# Patient Record
Sex: Male | Born: 1980 | Race: Black or African American | Hispanic: No | Marital: Single | Smoking: Former smoker
Health system: Southern US, Community
[De-identification: ages and names within clinical notes are randomized; demographics above are authoritative.]

## PROBLEM LIST (undated history)

## (undated) DIAGNOSIS — F1210 Cannabis abuse, uncomplicated: Secondary | ICD-10-CM

## (undated) DIAGNOSIS — D509 Iron deficiency anemia, unspecified: Secondary | ICD-10-CM

## (undated) DIAGNOSIS — N183 Chronic kidney disease, stage 3 unspecified: Secondary | ICD-10-CM

## (undated) DIAGNOSIS — I10 Essential (primary) hypertension: Secondary | ICD-10-CM

## (undated) DIAGNOSIS — Z720 Tobacco use: Secondary | ICD-10-CM

## (undated) DIAGNOSIS — E669 Obesity, unspecified: Secondary | ICD-10-CM

## (undated) DIAGNOSIS — I5042 Chronic combined systolic (congestive) and diastolic (congestive) heart failure: Secondary | ICD-10-CM

## (undated) HISTORY — PX: KNEE SURGERY: SHX244

---

## 2012-01-23 ENCOUNTER — Inpatient Hospital Stay (HOSPITAL_COMMUNITY)
Admission: EM | Admit: 2012-01-23 | Discharge: 2012-01-24 | DRG: 304 | Disposition: A | Discharge status: Home / Self Care | Payer: 59 | Attending: Internal Medicine | Admitting: Internal Medicine

## 2012-01-23 ENCOUNTER — Encounter (HOSPITAL_COMMUNITY): Payer: Self-pay | Admitting: Emergency Medicine

## 2012-01-23 ENCOUNTER — Emergency Department (HOSPITAL_COMMUNITY): Payer: 59

## 2012-01-23 DIAGNOSIS — D72829 Elevated white blood cell count, unspecified: Secondary | ICD-10-CM | POA: Diagnosis present

## 2012-01-23 DIAGNOSIS — R0789 Other chest pain: Secondary | ICD-10-CM | POA: Diagnosis present

## 2012-01-23 DIAGNOSIS — I10 Essential (primary) hypertension: Principal | ICD-10-CM | POA: Diagnosis present

## 2012-01-23 DIAGNOSIS — N179 Acute kidney failure, unspecified: Secondary | ICD-10-CM | POA: Diagnosis present

## 2012-01-23 DIAGNOSIS — I498 Other specified cardiac arrhythmias: Secondary | ICD-10-CM | POA: Diagnosis present

## 2012-01-23 DIAGNOSIS — Z9119 Patient's noncompliance with other medical treatment and regimen: Secondary | ICD-10-CM

## 2012-01-23 DIAGNOSIS — D638 Anemia in other chronic diseases classified elsewhere: Secondary | ICD-10-CM | POA: Diagnosis present

## 2012-01-23 DIAGNOSIS — D649 Anemia, unspecified: Secondary | ICD-10-CM

## 2012-01-23 DIAGNOSIS — Z91199 Patient's noncompliance with other medical treatment and regimen due to unspecified reason: Secondary | ICD-10-CM

## 2012-01-23 DIAGNOSIS — J189 Pneumonia, unspecified organism: Secondary | ICD-10-CM | POA: Diagnosis present

## 2012-01-23 DIAGNOSIS — I169 Hypertensive crisis, unspecified: Secondary | ICD-10-CM

## 2012-01-23 DIAGNOSIS — I160 Hypertensive urgency: Secondary | ICD-10-CM | POA: Diagnosis present

## 2012-01-23 DIAGNOSIS — D509 Iron deficiency anemia, unspecified: Secondary | ICD-10-CM | POA: Diagnosis present

## 2012-01-23 DIAGNOSIS — F1210 Cannabis abuse, uncomplicated: Secondary | ICD-10-CM | POA: Diagnosis present

## 2012-01-23 HISTORY — DX: Essential (primary) hypertension: I10

## 2012-01-23 HISTORY — DX: Obesity, unspecified: E66.9

## 2012-01-23 LAB — CBC WITH DIFFERENTIAL/PLATELET
Basophils Relative: 0 % (ref 0–1)
Eosinophils Relative: 0 % (ref 0–5)
HCT: 34 % — ABNORMAL LOW (ref 39.0–52.0)
MCH: 24.3 pg — ABNORMAL LOW (ref 26.0–34.0)
Monocytes Absolute: 0.6 10*3/uL (ref 0.1–1.0)
Monocytes Relative: 6 % (ref 3–12)
Neutrophils Relative %: 78 % — ABNORMAL HIGH (ref 43–77)

## 2012-01-23 LAB — COMPREHENSIVE METABOLIC PANEL
AST: 17 U/L (ref 0–37)
BUN: 20 mg/dL (ref 6–23)
CO2: 23 mEq/L (ref 19–32)
Calcium: 9 mg/dL (ref 8.4–10.5)
Chloride: 105 mEq/L (ref 96–112)
Creatinine, Ser: 1.31 mg/dL (ref 0.50–1.35)
GFR calc Af Amer: 84 mL/min — ABNORMAL LOW (ref >90)
GFR calc non Af Amer: 72 mL/min — ABNORMAL LOW (ref >90)
Glucose, Bld: 117 mg/dL — ABNORMAL HIGH (ref 70–99)
Total Bilirubin: 1 mg/dL (ref 0.3–1.2)

## 2012-01-23 LAB — PHOSPHORUS: Phosphorus: 3.7 mg/dL (ref 2.3–4.6)

## 2012-01-23 LAB — BASIC METABOLIC PANEL
CO2: 23 mEq/L (ref 19–32)
Calcium: 9 mg/dL (ref 8.4–10.5)
Chloride: 104 mEq/L (ref 96–112)
Glucose, Bld: 129 mg/dL — ABNORMAL HIGH (ref 70–99)
Potassium: 4.1 mEq/L (ref 3.5–5.1)
Sodium: 139 mEq/L (ref 135–145)

## 2012-01-23 LAB — LACTIC ACID, PLASMA: Lactic Acid, Venous: 1.1 mmol/L (ref 0.5–2.2)

## 2012-01-23 LAB — CBC
Hemoglobin: 11.8 g/dL — ABNORMAL LOW (ref 13.0–17.0)
MCH: 24.3 pg — ABNORMAL LOW (ref 26.0–34.0)
RBC: 4.85 MIL/uL (ref 4.22–5.81)
WBC: 11 10*3/uL — ABNORMAL HIGH (ref 4.0–10.5)

## 2012-01-23 LAB — APTT: aPTT: 34 seconds (ref 24–37)

## 2012-01-23 LAB — GLUCOSE, CAPILLARY
Glucose-Capillary: 121 mg/dL — ABNORMAL HIGH (ref 70–99)
Glucose-Capillary: 114 mg/dL — ABNORMAL HIGH (ref 70–99)

## 2012-01-23 LAB — TROPONIN I: Troponin I: <0.3 ng/mL (ref <0.30)

## 2012-01-23 LAB — POCT I-STAT TROPONIN I: Troponin i, poc: 0.06 ng/mL (ref 0.00–0.08)

## 2012-01-23 LAB — PROTIME-INR: INR: 1.16 (ref 0.00–1.49)

## 2012-01-23 LAB — TSH: TSH: 1.217 u[IU]/mL (ref 0.350–4.500)

## 2012-01-23 MED ORDER — CLONIDINE HCL 0.2 MG PO TABS
0.2000 mg | ORAL_TABLET | Freq: Two times a day (BID) | ORAL | Status: DC
  Filled 2012-01-23 (×2): qty 1

## 2012-01-23 MED ORDER — MORPHINE SULFATE 4 MG/ML IJ SOLN
4.0000 mg | Freq: Once | INTRAMUSCULAR | Status: AC
  Administered 2012-01-23: 4 mg via INTRAVENOUS
  Filled 2012-01-23: qty 1

## 2012-01-23 MED ORDER — ONDANSETRON HCL 4 MG/2ML IJ SOLN: 4.0000 mg | Freq: Four times a day (QID) | INTRAMUSCULAR | Status: DC | PRN

## 2012-01-23 MED ORDER — HYDROCODONE-ACETAMINOPHEN 5-325 MG PO TABS
1.0000 | ORAL_TABLET | ORAL | Status: DC | PRN
  Administered 2012-01-23 – 2012-01-24 (×2): 2 via ORAL
  Filled 2012-01-23 (×2): qty 2

## 2012-01-23 MED ORDER — HYDRALAZINE HCL 50 MG PO TABS
100.0000 mg | ORAL_TABLET | Freq: Three times a day (TID) | ORAL | Status: DC
  Administered 2012-01-23 – 2012-01-24 (×3): 100 mg via ORAL
  Filled 2012-01-23 (×5): qty 2

## 2012-01-23 MED ORDER — MAGNESIUM OXIDE 400 (241.3 MG) MG PO TABS
400.0000 mg | ORAL_TABLET | Freq: Every day | ORAL | Status: DC
  Administered 2012-01-23 – 2012-01-24 (×2): 400 mg via ORAL
  Filled 2012-01-23 (×2): qty 1

## 2012-01-23 MED ORDER — NITROGLYCERIN IN D5W 200-5 MCG/ML-% IV SOLN
5.0000 ug/min | INTRAVENOUS | Status: DC
  Administered 2012-01-23: 5 ug/min via INTRAVENOUS
  Filled 2012-01-23: qty 250

## 2012-01-23 MED ORDER — CARVEDILOL 25 MG PO TABS
25.0000 mg | ORAL_TABLET | Freq: Two times a day (BID) | ORAL | Status: DC
  Administered 2012-01-23 – 2012-01-24 (×2): 25 mg via ORAL
  Filled 2012-01-23 (×4): qty 1

## 2012-01-23 MED ORDER — HYDRALAZINE HCL 100 MG PO TABS: 100.0000 mg | ORAL_TABLET | Freq: Three times a day (TID) | ORAL | Status: DC

## 2012-01-23 MED ORDER — ONDANSETRON HCL 4 MG/2ML IJ SOLN
4.0000 mg | Freq: Once | INTRAMUSCULAR | Status: AC
  Administered 2012-01-23: 4 mg via INTRAVENOUS
  Filled 2012-01-23: qty 2

## 2012-01-23 MED ORDER — CLONIDINE HCL 0.3 MG PO TABS
0.3000 mg | ORAL_TABLET | Freq: Three times a day (TID) | ORAL | Status: DC
  Administered 2012-01-23 – 2012-01-24 (×4): 0.3 mg via ORAL
  Filled 2012-01-23 (×8): qty 1

## 2012-01-23 MED ORDER — FUROSEMIDE 10 MG/ML IJ SOLN
40.0000 mg | Freq: Every day | INTRAMUSCULAR | Status: DC
Start: 2012-01-23 — End: 2012-01-24
  Administered 2012-01-23: 40 mg via INTRAVENOUS
  Filled 2012-01-23 (×2): qty 4

## 2012-01-23 MED ORDER — AMLODIPINE BESYLATE 10 MG PO TABS
10.0000 mg | ORAL_TABLET | Freq: Every day | ORAL | Status: DC
  Administered 2012-01-23 – 2012-01-24 (×2): 10 mg via ORAL
  Filled 2012-01-23 (×2): qty 1

## 2012-01-23 MED ORDER — ONDANSETRON HCL 4 MG PO TABS: 4.0000 mg | ORAL_TABLET | Freq: Four times a day (QID) | ORAL | Status: DC | PRN

## 2012-01-23 MED ORDER — SODIUM CHLORIDE 0.9 % IJ SOLN
3.0000 mL | Freq: Two times a day (BID) | INTRAMUSCULAR | Status: DC
  Administered 2012-01-23 – 2012-01-24 (×3): 3 mL via INTRAVENOUS

## 2012-01-23 MED ORDER — MAGNESIUM OXIDE 400 MG PO TABS: 400.0000 mg | ORAL_TABLET | Freq: Every day | ORAL | Status: DC

## 2012-01-23 MED ORDER — LEVOFLOXACIN IN D5W 750 MG/150ML IV SOLN
750.0000 mg | INTRAVENOUS | Status: DC
  Administered 2012-01-23: 750 mg via INTRAVENOUS
  Filled 2012-01-23 (×2): qty 150

## 2012-01-23 MED ORDER — NICARDIPINE HCL IN NACL 20-0.86 MG/200ML-% IV SOLN
5.0000 mg/h | Freq: Once | INTRAVENOUS | Status: AC
  Administered 2012-01-23: 5 mg/h via INTRAVENOUS
  Filled 2012-01-23 (×2): qty 200

## 2012-01-23 MED ORDER — IOHEXOL 350 MG/ML SOLN: 125.0000 mL | Freq: Once | INTRAVENOUS | Status: AC | PRN

## 2012-01-23 NOTE — ED Notes (Signed)
Pt alert and oriented x4. Respirations even and unlabored, bilateral symmetrical rise and fall of chest. Skin warm and dry. In no acute distress. Denies needs.   

## 2012-01-23 NOTE — ED Notes (Signed)
Pt alert, arrives from work, c/o chest pain, sob, onset was a few days ago, describe the pain as "tightness in my chest, sob", resp even unlabored, ambulates to triage

## 2012-01-23 NOTE — ED Provider Notes (Signed)
History     CSN: [id number]  Arrival date & time 01/23/12  0227   First MD Initiated Contact with Patient 01/23/12 0319      Chief Complaint  Patient presents with  . Chest Pain    (Consider location/radiation/quality/duration/timing/severity/associated sxs/prior treatment) HPI History provided by patient. Over the last week has been having worsening chest pain, sharp in quality associated shortness of breath. Worse with exertion. Symptoms have been moderate and tonight became more severe while ambulating at work. No leg pain or leg swelling. No cough or fevers. Has a history of hypertension but has not been taking medications in months. Last year he required admission to the hospital for elevated blood pressure. He was sent home with multiple blood pressure medications that he was unable to afford. No headache, abdominal pain weakness. No nausea vomiting. Pain is not radiating and currently 9/10 in severity. He is having the same symptoms that require admission the past. Past Medical History  Diagnosis Date  . Hypertension     History reviewed. No pertinent past surgical history.  No family history on file.  History  Substance Use Topics  . Smoking status: Never Smoker   . Smokeless tobacco: Not on file  . Alcohol Use: No      Review of Systems  Constitutional: Negative for fever and chills.  HENT: Negative for neck pain and neck stiffness.   Eyes: Negative for pain.  Respiratory: Positive for shortness of breath.   Cardiovascular: Positive for chest pain.  Gastrointestinal: Negative for abdominal pain.  Genitourinary: Negative for dysuria.  Musculoskeletal: Negative for back pain.  Skin: Negative for rash.  Neurological: Negative for headaches.  All other systems reviewed and are negative.    Allergies  Review of patient's allergies indicates no known allergies.  Home Medications   Current Outpatient Rx  Name  Route  Sig  Dispense  Refill  . HYDRALAZINE HCL  PO   Oral   Take by mouth.         Marland Kitchen LISINOPRIL PO   Oral   Take by mouth.         . OXYCODONE HCL PO   Oral   Take by mouth.         . COREG PO   Oral   Take by mouth.         . CLONIDINE HCL PO   Oral   Take by mouth.         Marland Kitchen LASIX PO   Oral   Take by mouth.         Marland Kitchen MAGNESIUM PO   Oral   Take by mouth.           BP 192/123  Pulse 121  Temp 98.7 F (37.1 C) (Oral)  Resp 22  Ht 6\' 2"  (1.88 m)  Wt 315 lb (142.883 kg)  BMI 40.44 kg/m2  SpO2 100%  Physical Exam  Constitutional: He is oriented to person, place, and time. He appears well-developed and well-nourished.  HENT:  Head: Normocephalic and atraumatic.  Eyes: Conjunctivae normal and EOM are normal. Pupils are equal, round, and reactive to light.  Neck: Trachea normal. Neck supple. No thyromegaly present.  Cardiovascular: Normal rate, regular rhythm, S1 normal, S2 normal and normal pulses.     No systolic murmur is present   No diastolic murmur is present  Pulses:      Radial pulses are 2+ on the right side, and 2+ on the left side.  Pulmonary/Chest:  He has no rhonchi. He exhibits no tenderness.       Decreased breath sounds worse on the right. No respiratory distress.   Abdominal: Soft. Normal appearance and bowel sounds are normal. There is no tenderness. There is no CVA tenderness and negative Murphy's sign.  Musculoskeletal:       BLE:s Calves nontender, no cords or erythema, negative Homans sign  Neurological: He is alert and oriented to person, place, and time. He has normal strength. No cranial nerve deficit or sensory deficit. GCS eye subscore is 4. GCS verbal subscore is 5. GCS motor subscore is 6.  Skin: Skin is warm and dry. No rash noted. He is not diaphoretic.  Psychiatric: His speech is normal.       Cooperative and appropriate    ED Course  Procedures (including critical care time)  Results for orders placed during the hospital encounter of 01/23/12  CBC       Component Value Range   WBC 11.0 (*) 4.0 - 10.5 K/uL   RBC 4.85  4.22 - 5.81 MIL/uL   Hemoglobin 11.8 (*) 13.0 - 17.0 g/dL   HCT 16.1 (*) 09.6 - 04.5 %   MCV 69.7 (*) 78.0 - 100.0 fL   MCH 24.3 (*) 26.0 - 34.0 pg   MCHC 34.9  30.0 - 36.0 g/dL   RDW 40.9 (*) 81.1 - 91.4 %   Platelets 150  150 - 400 K/uL  BASIC METABOLIC PANEL      Component Value Range   Sodium 139  135 - 145 mEq/L   Potassium 4.1  3.5 - 5.1 mEq/L   Chloride 104  96 - 112 mEq/L   CO2 23  19 - 32 mEq/L   Glucose, Bld 129 (*) 70 - 99 mg/dL   BUN 22  6 - 23 mg/dL   Creatinine, Ser 7.82 (*) 0.50 - 1.35 mg/dL   Calcium 9.0  8.4 - 95.6 mg/dL   GFR calc non Af Amer 64 (*) >90 mL/min   GFR calc Af Amer 74 (*) >90 mL/min  LACTIC ACID, PLASMA      Component Value Range   Lactic Acid, Venous 1.1  0.5 - 2.2 mmol/L  TROPONIN I      Component Value Range   Troponin I <0.30  <0.30 ng/mL  POCT I-STAT TROPONIN I      Component Value Range   Troponin i, poc 0.06  0.00 - 0.08 ng/mL   Comment 3            Ct Angio Chest Pe W/cm &/or Wo Cm  01/23/2012  *RADIOLOGY REPORT*  Clinical Data: Chest pain and tightness for a few days.  Shortness of breath.  CT ANGIOGRAPHY CHEST  Technique:  Multidetector CT imaging of the chest using the standard protocol during bolus administration of intravenous contrast. Multiplanar reconstructed images including MIPs were obtained and reviewed to evaluate the vascular anatomy.  Contrast:  125 ml Omnipaque 350  Comparison: Chest radiograph 01/23/2012.  Findings: Study is somewhat technically limited due to limited contrast bolus but there is relatively good visualization of the central and proximal segmental pulmonary arteries.  No focal filling defects are demonstrated in the visualized vessels.  No evidence of large central pulmonary embolus.  Patchy diffusely scattered areas of airspace consolidation with air bronchograms in both lungs, greater on the right, similar to prior radiographic appearance.   Changes may represent pneumonia, edema, or other consolidative process.  Diffuse cardiac enlargement.  Normal caliber thoracic aorta.  No significant mediastinal lymphadenopathy.  Esophagus is decompressed.  The no pleural effusions.  No pneumothorax.  Mild degenerative changes in the thoracic spine.  Images incidentally obtained of the upper abdomen demonstrate a small esophageal hiatal hernia.  IMPRESSION: Patchy diffuse scattered airspace consolidation in both lungs, greater on the right side. Technically suboptimal study for evaluation of pulmonary arteries but no central pulmonary emboli are visualized.   Original Report Authenticated By: Burman Nieves, M.D.    Dg Chest Port 1 View  01/23/2012  *RADIOLOGY REPORT*  Clinical Data: Right-sided chest pain.  Shortness of breath. Productive cough.  Blood in sputum.  PORTABLE CHEST - 1 VIEW  Comparison: None.  Findings: Shallow inspiration.  Cardiac enlargement with mild pulmonary vascular congestion.  There is diffuse airspace disease in the right with mild left perihilar airspace disease.  Findings may represent edema or pneumonia.  Alveolar hemorrhage can also have this appearance.  No blunting of costophrenic angles.  No pneumothorax.  IMPRESSION: Cardiac enlargement with pulmonary vascular congestion.  Bilateral perihilar infiltrates more prominent on the right suggesting pneumonia or edema.   Original Report Authenticated By: Burman Nieves, M.D.       Date: 01/23/2012  Rate: 129  Rhythm: sinus tachycardia  QRS Axis: left  Intervals: normal  ST/T Wave abnormalities: nonspecific ST changes  Conduction Disutrbances:none  Narrative Interpretation: ST with LVH  Old EKG Reviewed: none available  CRITICAL CARE Performed by: Sunnie Nielsen   Total critical care time: 40  Critical care time was exclusive of separately billable procedures and treating other patients.  Critical care was necessary to treat or prevent imminent or life-threatening  deterioration.  Critical care was time spent personally by me on the following activities: development of treatment plan with patient and/or surrogate as well as nursing, discussions with consultants, evaluation of patient's response to treatment, examination of patient, obtaining history from patient or surrogate, ordering and performing treatments and interventions, ordering and review of laboratory studies, ordering and review of radiographic studies, pulse oximetry and re-evaluation of patient's condition. IV NTG/Cardene drip. Close cardiac monitoring with blood pressure relatively unchanged. Pain symptoms improving.  0530: d/w DR Julian Reil, [facility], plan ICU admit. He requested PCCM consult. He evaluated patient bedside. He recommends stop NTG drip and start Cardene. BP has been unchanged with titrating NTG.  Case discussed as above with Dr Deterding PCCM and she feels PT can be admitted by MED, can consult in the am as needed.    MDM  Hypertensive urgency and 31 year old adult male with history of same. Is noncompliant with hypertension medications.  Workup as above with chest x-ray, CT scan, EKG and labs reviewed. No old records available. Vital signs and nursing notes reviewed.  IV drips for blood pressure control. IV morphine.   ICU admit      Sunnie Nielsen, MD 01/23/12 [phone]

## 2012-01-23 NOTE — H&P (Addendum)
[facility] History and Physical  Peter Dodson ZOX:[id number] DOB: 05-08-1980 DOA: 01/23/2012  Referring physician: ER physician PCP: Provider Not In System   Chief Complaint: chest pain  HPI:  31 year old male with history of HTN (multiple meds for BP control) and non-compliance secondary to financial resources who presented to ED with worsening chest pain over 1 day prior to this admission. Patient reported left sided chest pain, non radiating, sharp and 6-7/10 in intensity. Patient reports pain lasting from few minutes to hours. Pain is not relieved with rest but once the patient was started on nitroglycerin he felt better. Patient reported no associated nausea or vomiting. No shortness of breath, no palpitations, no fever or chills. No cough, no lightheadedness or dizziness or loss of consciousness. No headaches. No reports of blood in stool or urine. In Ed, patient was found to be hypertensive with BP 211/129 in addition to acute kidney injury. CT angio chest was done and was negative for PE. CXR showed possible pneumonia.  Assessment and Plan:  Principal Problem:  *Hypertensive crisis  With acute kidney injury secondary to non compliance with BP meds including clonidine, lasix, metoprolol  PE ruled out with negative CT chest angio  Started on Cardene drip and now SBP 150; appropriate response  We will restart clonidine 03. Mg TID per home regimen and we will slowly add home meds while tapering Cardene drip  Check UDS  Get 2 D ECHO  CM for help for financial resources  Active Problems:  Acute kidney injury  Secondary to hypertensive crisis  Patient did receive IV fluids in ED  We will stop the fluids  Check BMP in am  Leukocytosis  Likely due to pneumonia  Started Levaquin  Anemia  Probably of chronic disease  Hemoglobin stable on admission with no signs of active bleed  [name] [facility] [phone]   Review of Systems:  Constitutional: Negative for  fever, chills and malaise/fatigue. Negative for diaphoresis.  HENT: Negative for hearing loss, ear pain, nosebleeds, congestion, sore throat, neck pain, tinnitus and ear discharge.   Eyes: Negative for blurred vision, double vision, photophobia, pain, discharge and redness.  Respiratory: Negative for cough, hemoptysis, sputum production, shortness of breath, wheezing and stridor.   Cardiovascular: per HPI.  Gastrointestinal: Negative for nausea, vomiting and abdominal pain. Negative for heartburn, constipation, blood in stool and melena.  Genitourinary: Negative for dysuria, urgency, frequency, hematuria and flank pain.  Musculoskeletal: Negative for myalgias, back pain, joint pain and falls.  Skin: Negative for itching and rash.  Neurological: Negative for dizziness and weakness. Negative for tingling, tremors, sensory change, speech change, focal weakness, loss of consciousness and headaches.  Endo/Heme/Allergies: Negative for environmental allergies and polydipsia. Does not bruise/bleed easily.  Psychiatric/Behavioral: Negative for suicidal ideas. The patient is not nervous/anxious.      Past Medical History  Diagnosis Date  . Hypertension    History reviewed. No pertinent past surgical history. Social History:  reports that he has never smoked. He does not have any smokeless tobacco history on file. He reports that he does not drink alcohol. His drug history not on file.  No Known Allergies  Family History:  Family History  Problem Relation Age of Onset  . Hypertension Mother      Prior to Admission medications   Medication Sig Start Date End Date Taking? Authorizing Provider  HYDRALAZINE HCL PO Take by mouth.   Yes Historical Provider, MD  LISINOPRIL PO Take by mouth.   Yes Historical  Provider, MD  OXYCODONE HCL PO Take by mouth.   Yes Historical Provider, MD  Carvedilol (COREG PO) Take by mouth.    Historical Provider, MD  CLONIDINE HCL PO Take by mouth.    Historical  Provider, MD  Furosemide (LASIX PO) Take by mouth.    Historical Provider, MD  MAGNESIUM PO Take by mouth.    Historical Provider, MD   Physical Exam: Filed Vitals:   01/23/12 1610 01/23/12 0658 01/23/12 0710 01/23/12 0730  BP: 182/113 178/113 180/102 164/114  Pulse: 121 118 121 119  Temp:    98 F (36.7 C)  TempSrc:    Oral  Resp: 30 14 19 18   Height:      Weight:      SpO2: 100% 99% 100% 93%    Physical Exam  Constitutional: Appears well-developed and well-nourished. No distress.  HENT: Normocephalic. External right and left ear normal. Oropharynx is clear and moist.  Eyes: Conjunctivae and EOM are normal. PERRLA, no scleral icterus.  Neck: Normal ROM. Neck supple. No JVD. No tracheal deviation. No thyromegaly.  CVS: RRR, S1/S2 +, no murmurs, no gallops, no carotid bruit.  Pulmonary: Effort and breath sounds normal, no stridor, rhonchi, wheezes, rales.  Abdominal: Soft. BS +,  no distension, tenderness, rebound or guarding.  Musculoskeletal: Normal range of motion. No edema and no tenderness.  Lymphadenopathy: No lymphadenopathy noted, cervical, inguinal. Neuro: Alert. Normal reflexes, muscle tone coordination. No cranial nerve deficit. Skin: Skin is warm and dry. No rash noted. Not diaphoretic. No erythema. No pallor.  Psychiatric: Normal mood and affect. Behavior, judgment, thought content normal.   Labs on Admission:  Basic Metabolic Panel:  Lab 01/23/12 9604  NA 139  K 4.1  CL 104  CO2 23  GLUCOSE 129*  BUN 22  CREATININE 1.45*  CALCIUM 9.0  MG --  PHOS --   Liver Function Tests: No results found for this basename: AST:5,ALT:5,ALKPHOS:5,BILITOT:5,PROT:5,ALBUMIN:5 in the last 168 hours No results found for this basename: LIPASE:5,AMYLASE:5 in the last 168 hours No results found for this basename: AMMONIA:5 in the last 168 hours CBC:  Lab 01/23/12 0305  WBC 11.0*  NEUTROABS --  HGB 11.8*  HCT 33.8*  MCV 69.7*  PLT 150   Cardiac Enzymes:  Lab 01/23/12  0305  CKTOTAL --  CKMB --  TROPONINI <0.30   BNP: No components found with this basename: POCBNP:5 CBG: No results found for this basename: GLUCAP:5 in the last 168 hours  Radiological Exams on Admission: Ct Angio Chest Pe W/cm &/or Wo Cm 01/23/2012 IMPRESSION: Patchy diffuse scattered airspace consolidation in both lungs, greater on the right side. Technically suboptimal study for evaluation of pulmonary arteries but no central pulmonary emboli are visualized.   Original Report Authenticated By: Burman Nieves, M.D.    Dg Chest Port 1 View 01/23/2012  *  IMPRESSION: Cardiac enlargement with pulmonary vascular congestion.  Bilateral perihilar infiltrates more prominent on the right suggesting pneumonia or edema.   Original Report Authenticated By: Burman Nieves, M.D.     EKG: Normal sinus rhythm, no ST/T wave changes  Code Status: Full Family Communication: Pt at bedside Disposition Plan: Admit for further evaluation  [name], MD  [facility] Pager [phone]  If 7PM-7AM, please contact night-coverage [email or web address] Password [facility] 01/23/2012, 7:49 AM

## 2012-01-23 NOTE — ED Notes (Addendum)
Notified RN, Drenda Freeze patient complaining of chest and back pain. Notified RN, Drenda Freeze of elevated blood pressure and heart rate of 121.

## 2012-01-23 NOTE — ED Notes (Signed)
Pt returned from radiology.

## 2012-01-23 NOTE — ED Notes (Signed)
Pt taken to radiology

## 2012-01-23 NOTE — ED Notes (Signed)
Report called to kim, rn on floor

## 2012-01-23 NOTE — ED Notes (Signed)
First attempt to call report. rn in isolation room, will call back

## 2012-01-24 LAB — RAPID URINE DRUG SCREEN, HOSP PERFORMED
Barbiturates: NOT DETECTED
Cocaine: NOT DETECTED
Tetrahydrocannabinol: POSITIVE — AB

## 2012-01-24 LAB — CBC
HCT: 31.2 % — ABNORMAL LOW (ref 39.0–52.0)
Hemoglobin: 10.8 g/dL — ABNORMAL LOW (ref 13.0–17.0)
MCH: 24.2 pg — ABNORMAL LOW (ref 26.0–34.0)
RBC: 4.46 MIL/uL (ref 4.22–5.81)

## 2012-01-24 LAB — COMPREHENSIVE METABOLIC PANEL
ALT: 11 U/L (ref 0–53)
AST: 14 U/L (ref 0–37)
CO2: 29 mEq/L (ref 19–32)
Calcium: 8.8 mg/dL (ref 8.4–10.5)
Creatinine, Ser: 1.51 mg/dL — ABNORMAL HIGH (ref 0.50–1.35)
GFR calc non Af Amer: 61 mL/min — ABNORMAL LOW (ref >90)
Sodium: 136 mEq/L (ref 135–145)
Total Protein: 6.2 g/dL (ref 6.0–8.3)

## 2012-01-24 LAB — GLUCOSE, CAPILLARY: Glucose-Capillary: 98 mg/dL (ref 70–99)

## 2012-01-24 MED ORDER — LEVOFLOXACIN 500 MG PO TABS: 500.0000 mg | ORAL_TABLET | Freq: Every day | ORAL | Status: DC

## 2012-01-24 MED ORDER — AMLODIPINE BESYLATE 10 MG PO TABS: 10.0000 mg | ORAL_TABLET | Freq: Every day | ORAL | Status: DC

## 2012-01-24 MED ORDER — HYDRALAZINE HCL 100 MG PO TABS: 100.0000 mg | ORAL_TABLET | Freq: Three times a day (TID) | ORAL | Status: DC

## 2012-01-24 MED ORDER — CARVEDILOL 25 MG PO TABS: 25.0000 mg | ORAL_TABLET | Freq: Two times a day (BID) | ORAL | Status: DC

## 2012-01-24 MED ORDER — HYDROCODONE-ACETAMINOPHEN 5-325 MG PO TABS: 1.0000 | ORAL_TABLET | Freq: Four times a day (QID) | ORAL | Status: DC | PRN

## 2012-01-24 MED ORDER — HYDROCODONE-ACETAMINOPHEN 5-325 MG PO TABS: 1.0000 | ORAL_TABLET | ORAL | Status: DC | PRN

## 2012-01-24 MED ORDER — CLONIDINE HCL 0.3 MG PO TABS: 0.3000 mg | ORAL_TABLET | Freq: Three times a day (TID) | ORAL | Status: DC

## 2012-01-24 NOTE — Discharge Summary (Signed)
Physician Discharge Summary  Peter Dodson AVW:[id number] DOB: 1980/02/14 DOA: 01/23/2012  PCP: Provider Not In System  Admit date: 01/23/2012 Discharge date: 01/24/2012  Discharge Diagnoses:  Principal Problem:  *Hypertensive crisis Active Problems:  Acute kidney injury  Leukocytosis  Anemia  Discharge Condition: medically stable for discharge home today. I have advised the patient to stop take lisinopril and lasix due to acute kidney injury and then to follow up with his PCP to recheck kidney function.   Diet recommendation: low fat diet  History of present illness:  31 year old male with history of HTN (multiple meds for BP control) and non-compliance secondary to financial resources who presented to ED with worsening chest pain over 1 day prior to this admission. Patient reported left sided chest pain, non radiating, sharp and 6-7/10 in intensity. Patient reported pain lasting from few minutes to hours. Pain is not relieved with rest but once the patient was started on nitroglycerin he felt better. In Ed, patient was found to be hypertensive with BP 211/129 in addition to acute kidney injury. CT angio chest was done and was negative for PE. CXR showed possible pneumonia.   Assessment and Plan:   Principal Problem:  *Hypertensive crisis  With acute kidney injury secondary to non compliance with BP meds including clonidine, lasix, metoprolol  PE ruled out with negative CT chest angio  Started on Cardene drip and eventually we added home medications BP is now stable  UDS positive for opiates and THC CM for help for financial resources  Active Problems:  Acute kidney injury  Secondary to hypertensive crisis, lisinopril and lasix Stopped lasix and would not continue lisinopril (home medication) Leukocytosis  Likely due to pneumonia  Started Levaquin which the patient will continue for the next 7 days on discharge Anemia  Probably of chronic disease  No signs of active  bleed Hemoglobin 10.8 today  [name]  [facility]  [phone]      Discharge Exam: Filed Vitals:   01/24/12 0400  BP:   Pulse:   Temp: 98.2 F (36.8 C)  Resp:    Filed Vitals:   01/24/12 0042 01/24/12 0100 01/24/12 0300 01/24/12 0400  BP: 122/87 135/85 113/70   Pulse:  77    Temp:    98.2 F (36.8 C)  TempSrc:    Oral  Resp: 26 20 26    Height:      Weight:      SpO2: 92% 98% 98%     General: Pt is alert, follows commands appropriately, not in acute distress Cardiovascular: Regular rate and rhythm, S1/S2 +, no murmurs, no rubs, no gallops Respiratory: Clear to auscultation bilaterally, no wheezing, no crackles, no rhonchi Abdominal: Soft, non tender, non distended, bowel sounds +, no guarding Extremities: no edema, no cyanosis, pulses palpable bilaterally DP and PT Neuro: Grossly nonfocal  Discharge Instructions  Discharge Orders    Future Orders Please Complete By Expires   Diet - low sodium heart healthy      Increase activity slowly      Discharge instructions      Comments:   Please stop taking lisinopril and lasix due to kidney injury; follow up with your PCP in 2 weeks and have your kidney function rechecked.   Call MD for:  persistant nausea and vomiting      Call MD for:  severe uncontrolled pain      Call MD for:  difficulty breathing, headache or visual disturbances      Call MD  for:  persistant dizziness or light-headedness          Medication List     As of 01/24/2012  6:30 AM    STOP taking these medications         furosemide 20 MG tablet   Commonly known as: LASIX      lisinopril 40 MG tablet   Commonly known as: PRINIVIL,ZESTRIL      OXYCODONE HCL PO      TAKE these medications         amLODipine 10 MG tablet   Commonly known as: NORVASC   Take 1 tablet (10 mg total) by mouth daily.      carvedilol 25 MG tablet   Commonly known as: COREG   Take 1 tablet (25 mg total) by mouth 2 (two) times daily with a meal.      cloNIDine 0.3  MG tablet   Commonly known as: CATAPRES   Take 1 tablet (0.3 mg total) by mouth 3 (three) times daily.      hydrALAZINE 100 MG tablet   Commonly known as: APRESOLINE   Take 1 tablet (100 mg total) by mouth 3 (three) times daily.      HYDROcodone-acetaminophen 5-325 MG per tablet   Commonly known as: NORCO/VICODIN   Take 1-2 tablets by mouth every 4 (four) hours as needed.      magnesium oxide 400 MG tablet   Commonly known as: MAG-OX   Take 400 mg by mouth daily.           Follow-up Information    Follow up with Provider Not In System.          The results of significant diagnostics from this hospitalization (including imaging, microbiology, ancillary and laboratory) are listed below for reference.    Significant Diagnostic Studies: Ct Angio Chest Pe W/cm &/or Wo Cm  01/23/2012  *RADIOLOGY REPORT*  Clinical Data: Chest pain and tightness for a few days.  Shortness of breath.  CT ANGIOGRAPHY CHEST  Technique:  Multidetector CT imaging of the chest using the standard protocol during bolus administration of intravenous contrast. Multiplanar reconstructed images including MIPs were obtained and reviewed to evaluate the vascular anatomy.  Contrast:  125 ml Omnipaque 350  Comparison: Chest radiograph 01/23/2012.  Findings: Study is somewhat technically limited due to limited contrast bolus but there is relatively good visualization of the central and proximal segmental pulmonary arteries.  No focal filling defects are demonstrated in the visualized vessels.  No evidence of large central pulmonary embolus.  Patchy diffusely scattered areas of airspace consolidation with air bronchograms in both lungs, greater on the right, similar to prior radiographic appearance.  Changes may represent pneumonia, edema, or other consolidative process.  Diffuse cardiac enlargement.  Normal caliber thoracic aorta.  No significant mediastinal lymphadenopathy.  Esophagus is decompressed.  The no pleural  effusions.  No pneumothorax.  Mild degenerative changes in the thoracic spine.  Images incidentally obtained of the upper abdomen demonstrate a small esophageal hiatal hernia.  IMPRESSION: Patchy diffuse scattered airspace consolidation in both lungs, greater on the right side. Technically suboptimal study for evaluation of pulmonary arteries but no central pulmonary emboli are visualized.   Original Report Authenticated By: Burman Nieves, M.D.    Dg Chest Port 1 View  01/23/2012  *RADIOLOGY REPORT*  Clinical Data: Right-sided chest pain.  Shortness of breath. Productive cough.  Blood in sputum.  PORTABLE CHEST - 1 VIEW  Comparison: None.  Findings: Shallow inspiration.  Cardiac enlargement  with mild pulmonary vascular congestion.  There is diffuse airspace disease in the right with mild left perihilar airspace disease.  Findings may represent edema or pneumonia.  Alveolar hemorrhage can also have this appearance.  No blunting of costophrenic angles.  No pneumothorax.  IMPRESSION: Cardiac enlargement with pulmonary vascular congestion.  Bilateral perihilar infiltrates more prominent on the right suggesting pneumonia or edema.   Original Report Authenticated By: Burman Nieves, M.D.     Microbiology: Recent Results (from the past 240 hour(s))  MRSA PCR SCREENING     Status: Normal   Collection Time   01/23/12  8:57 AM      Component Value Range Status Comment   MRSA by PCR NEGATIVE  NEGATIVE Final      Labs: Basic Metabolic Panel:  Lab 01/24/12 4540 01/23/12 0825 01/23/12 0305  NA 136 139 139  K 4.3 3.8 4.1  CL 99 105 104  CO2 29 23 23   GLUCOSE 92 117* 129*  BUN 23 20 22   CREATININE 1.51* 1.31 1.45*  CALCIUM 8.8 9.0 9.0  MG -- 2.0 --  PHOS -- 3.7 --   Liver Function Tests:  Lab 01/24/12 0344 01/23/12 0825  AST 14 17  ALT 11 12  ALKPHOS 49 59  BILITOT 0.7 1.0  PROT 6.2 7.4  ALBUMIN 2.5* 3.1*   No results found for this basename: LIPASE:5,AMYLASE:5 in the last 168 hours No  results found for this basename: AMMONIA:5 in the last 168 hours CBC:  Lab 01/24/12 0344 01/23/12 0825 01/23/12 0305  WBC 4.9 9.3 11.0*  NEUTROABS -- 7.2 --  HGB 10.8* 12.0* 11.8*  HCT 31.2* 34.0* 33.8*  MCV 70.0* 68.8* 69.7*  PLT 127* 147* 150   Cardiac Enzymes:  Lab 01/23/12 0305  CKTOTAL --  CKMB --  CKMBINDEX --  TROPONINI <0.30   BNP: BNP (last 3 results) No results found for this basename: PROBNP:3 in the last 8760 hours CBG:  Lab 01/23/12 1225 01/23/12 0827  GLUCAP 114* 121*    Time coordinating discharge: Over 30 minutes  Signed:  [name], MD  [facility] 01/24/2012, 6:30 AM  Pager #: [phone]

## 2012-01-24 NOTE — Care Management Note (Signed)
    Page 1 of 1   01/24/2012     12:52:54 PM   CARE MANAGEMENT NOTE 01/24/2012  Patient:  Peter Dodson,Peter Dodson   Account Number:  [id number]  Date Initiated:  01/24/2012  Documentation initiated by:  Lanier Clam  Subjective/Objective Assessment:   admitted w/hypertensive crisis.     Action/Plan:   From home   Anticipated DC Date:  01/24/2012   Anticipated DC Plan:  HOME/SELF CARE      DC Planning Services  Medication Assistance  CM consult      Choice offered to / List presented to:             Status of service:  Completed, signed off Medicare Important Message given?   (If response is "NO", the following Medicare IM given date fields will be blank) Date Medicare IM given:   Date Additional Medicare IM given:    Discharge Disposition:  HOME/SELF CARE  Per UR Regulation:    If discussed at Long Length of Stay Meetings, dates discussed:    Comments:  01/24/12 Peter Hilgers RN,BSN NCM [phone] EXPLAINED THAT SINCE PATIENT HAS SCRIPT COVERAGE,DOES NOT QUALIFY FOR MED ASST WHEN YOU JUST CAN'T AFFORD MEDS.MEDS WERE CHECKED & MOST ARE ON $4 [employer].THOSE THAT WERE NOT $4 WERE:LEVAQUIN $10/5TABS.NORVASC-$5.NURSE UPDATED.

## 2012-01-24 NOTE — Progress Notes (Signed)
Utilization Review Completed.Cyprian Gongaware T12/15/2013   

## 2013-10-28 ENCOUNTER — Emergency Department (HOSPITAL_COMMUNITY): Payer: 59

## 2013-10-28 ENCOUNTER — Inpatient Hospital Stay (HOSPITAL_COMMUNITY)
Admission: EM | Admit: 2013-10-28 | Discharge: 2013-10-31 | DRG: 291 | Disposition: A | Discharge status: Home / Self Care | Payer: 59 | Attending: Internal Medicine | Admitting: Internal Medicine

## 2013-10-28 ENCOUNTER — Encounter (HOSPITAL_COMMUNITY): Payer: Self-pay | Admitting: Emergency Medicine

## 2013-10-28 DIAGNOSIS — I160 Hypertensive urgency: Secondary | ICD-10-CM | POA: Diagnosis present

## 2013-10-28 DIAGNOSIS — IMO0001 Reserved for inherently not codable concepts without codable children: Secondary | ICD-10-CM

## 2013-10-28 DIAGNOSIS — Z23 Encounter for immunization: Secondary | ICD-10-CM

## 2013-10-28 DIAGNOSIS — R0989 Other specified symptoms and signs involving the circulatory and respiratory systems: Secondary | ICD-10-CM

## 2013-10-28 DIAGNOSIS — I130 Hypertensive heart and chronic kidney disease with heart failure and stage 1 through stage 4 chronic kidney disease, or unspecified chronic kidney disease: Principal | ICD-10-CM | POA: Diagnosis present

## 2013-10-28 DIAGNOSIS — I110 Hypertensive heart disease with heart failure: Secondary | ICD-10-CM

## 2013-10-28 DIAGNOSIS — R030 Elevated blood-pressure reading, without diagnosis of hypertension: Secondary | ICD-10-CM

## 2013-10-28 DIAGNOSIS — E669 Obesity, unspecified: Secondary | ICD-10-CM | POA: Diagnosis present

## 2013-10-28 DIAGNOSIS — I5043 Acute on chronic combined systolic (congestive) and diastolic (congestive) heart failure: Secondary | ICD-10-CM | POA: Diagnosis present

## 2013-10-28 DIAGNOSIS — I43 Cardiomyopathy in diseases classified elsewhere: Secondary | ICD-10-CM | POA: Diagnosis present

## 2013-10-28 DIAGNOSIS — D509 Iron deficiency anemia, unspecified: Secondary | ICD-10-CM | POA: Diagnosis present

## 2013-10-28 DIAGNOSIS — N179 Acute kidney failure, unspecified: Secondary | ICD-10-CM | POA: Diagnosis present

## 2013-10-28 DIAGNOSIS — N189 Chronic kidney disease, unspecified: Secondary | ICD-10-CM | POA: Diagnosis present

## 2013-10-28 DIAGNOSIS — Z79899 Other long term (current) drug therapy: Secondary | ICD-10-CM

## 2013-10-28 DIAGNOSIS — R112 Nausea with vomiting, unspecified: Secondary | ICD-10-CM

## 2013-10-28 DIAGNOSIS — Z6841 Body Mass Index (BMI) 40.0 and over, adult: Secondary | ICD-10-CM

## 2013-10-28 DIAGNOSIS — R0609 Other forms of dyspnea: Secondary | ICD-10-CM

## 2013-10-28 DIAGNOSIS — D638 Anemia in other chronic diseases classified elsewhere: Secondary | ICD-10-CM | POA: Diagnosis present

## 2013-10-28 DIAGNOSIS — R110 Nausea: Secondary | ICD-10-CM

## 2013-10-28 DIAGNOSIS — F1210 Cannabis abuse, uncomplicated: Secondary | ICD-10-CM | POA: Diagnosis present

## 2013-10-28 DIAGNOSIS — I509 Heart failure, unspecified: Secondary | ICD-10-CM | POA: Diagnosis present

## 2013-10-28 LAB — RETICULOCYTES
RBC.: 5.11 MIL/uL (ref 4.22–5.81)
Retic Count, Absolute: 66.4 10*3/uL (ref 19.0–186.0)
Retic Ct Pct: 1.3 % (ref 0.4–3.1)

## 2013-10-28 LAB — BASIC METABOLIC PANEL
Anion gap: 10 (ref 5–15)
BUN: 23 mg/dL (ref 6–23)
CO2: 28 mEq/L (ref 19–32)
Calcium: 8.7 mg/dL (ref 8.4–10.5)
Chloride: 106 mEq/L (ref 96–112)
Creatinine, Ser: 1.61 mg/dL — ABNORMAL HIGH (ref 0.50–1.35)
GFR calc Af Amer: 64 mL/min — ABNORMAL LOW (ref >90)
GFR calc non Af Amer: 56 mL/min — ABNORMAL LOW (ref >90)
Glucose, Bld: 108 mg/dL — ABNORMAL HIGH (ref 70–99)
Potassium: 4.5 mEq/L (ref 3.7–5.3)
Sodium: 144 mEq/L (ref 137–147)

## 2013-10-28 LAB — I-STAT TROPONIN, ED: Troponin i, poc: 0.01 ng/mL (ref 0.00–0.08)

## 2013-10-28 LAB — CBC
HCT: 34.4 % — ABNORMAL LOW (ref 39.0–52.0)
Hemoglobin: 11.6 g/dL — ABNORMAL LOW (ref 13.0–17.0)
MCH: 23.4 pg — ABNORMAL LOW (ref 26.0–34.0)
MCHC: 33.7 g/dL (ref 30.0–36.0)
MCV: 69.5 fL — ABNORMAL LOW (ref 78.0–100.0)
Platelets: 108 10*3/uL — ABNORMAL LOW (ref 150–400)
RBC: 4.95 MIL/uL (ref 4.22–5.81)
RDW: 18.5 % — ABNORMAL HIGH (ref 11.5–15.5)
WBC: 5.3 10*3/uL (ref 4.0–10.5)

## 2013-10-28 LAB — TROPONIN I
Troponin I: <0.3 ng/mL (ref <0.30)
Troponin I: <0.3 ng/mL (ref <0.30)

## 2013-10-28 LAB — MRSA PCR SCREENING: MRSA by PCR: NEGATIVE

## 2013-10-28 LAB — PRO B NATRIURETIC PEPTIDE: PRO B NATRI PEPTIDE: 4456 pg/mL — AB (ref 0–125)

## 2013-10-28 MED ORDER — ONDANSETRON HCL 4 MG/2ML IJ SOLN: 4.0000 mg | Freq: Three times a day (TID) | INTRAMUSCULAR | Status: AC | PRN

## 2013-10-28 MED ORDER — CARVEDILOL 25 MG PO TABS
25.0000 mg | ORAL_TABLET | Freq: Two times a day (BID) | ORAL | Status: DC
  Filled 2013-10-28: qty 1

## 2013-10-28 MED ORDER — SODIUM CHLORIDE 0.9 % IV SOLN: 250.0000 mL | INTRAVENOUS | Status: DC | PRN

## 2013-10-28 MED ORDER — INFLUENZA VAC SPLIT QUAD 0.5 ML IM SUSY
0.5000 mL | PREFILLED_SYRINGE | INTRAMUSCULAR | Status: AC
  Administered 2013-10-29: 0.5 mL via INTRAMUSCULAR
  Filled 2013-10-28: qty 0.5

## 2013-10-28 MED ORDER — ACETAMINOPHEN 325 MG PO TABS
650.0000 mg | ORAL_TABLET | ORAL | Status: DC | PRN
  Administered 2013-10-28 – 2013-10-30 (×5): 650 mg via ORAL
  Filled 2013-10-28 (×6): qty 2

## 2013-10-28 MED ORDER — HYDRALAZINE HCL 50 MG PO TABS
100.0000 mg | ORAL_TABLET | Freq: Three times a day (TID) | ORAL | Status: DC
  Administered 2013-10-28 – 2013-10-31 (×9): 100 mg via ORAL
  Filled 2013-10-28 (×11): qty 2

## 2013-10-28 MED ORDER — ACETAMINOPHEN 325 MG PO TABS
650.0000 mg | ORAL_TABLET | Freq: Once | ORAL | Status: AC
  Administered 2013-10-28: 650 mg via ORAL
  Filled 2013-10-28: qty 2

## 2013-10-28 MED ORDER — ENOXAPARIN SODIUM 40 MG/0.4ML ~~LOC~~ SOLN
40.0000 mg | SUBCUTANEOUS | Status: DC
  Administered 2013-10-28: 40 mg via SUBCUTANEOUS
  Filled 2013-10-28 (×2): qty 0.4

## 2013-10-28 MED ORDER — MORPHINE SULFATE 2 MG/ML IJ SOLN
2.0000 mg | INTRAMUSCULAR | Status: DC | PRN
  Administered 2013-10-28 – 2013-10-29 (×7): 2 mg via INTRAVENOUS
  Filled 2013-10-28 (×7): qty 1

## 2013-10-28 MED ORDER — FUROSEMIDE 10 MG/ML IJ SOLN
20.0000 mg | Freq: Once | INTRAMUSCULAR | Status: AC
  Administered 2013-10-28: 20 mg via INTRAVENOUS
  Filled 2013-10-28: qty 2

## 2013-10-28 MED ORDER — SODIUM CHLORIDE 0.9 % IJ SOLN
3.0000 mL | Freq: Two times a day (BID) | INTRAMUSCULAR | Status: DC
  Administered 2013-10-28 – 2013-10-31 (×4): 3 mL via INTRAVENOUS

## 2013-10-28 MED ORDER — CARVEDILOL 25 MG PO TABS
25.0000 mg | ORAL_TABLET | Freq: Two times a day (BID) | ORAL | Status: DC
  Administered 2013-10-28 – 2013-10-31 (×7): 25 mg via ORAL
  Filled 2013-10-28 (×8): qty 1

## 2013-10-28 MED ORDER — NITROGLYCERIN IN D5W 200-5 MCG/ML-% IV SOLN
50.0000 ug/min | INTRAVENOUS | Status: DC
  Administered 2013-10-28: 175 ug/min via INTRAVENOUS
  Administered 2013-10-28: 50 ug/min via INTRAVENOUS
  Administered 2013-10-29: 200 ug/min via INTRAVENOUS
  Administered 2013-10-30: 50 ug/min via INTRAVENOUS
  Filled 2013-10-28 (×5): qty 250

## 2013-10-28 MED ORDER — CLONIDINE HCL 0.1 MG PO TABS
0.1000 mg | ORAL_TABLET | Freq: Three times a day (TID) | ORAL | Status: DC
  Administered 2013-10-28: 0.1 mg via ORAL
  Filled 2013-10-28 (×3): qty 1

## 2013-10-28 MED ORDER — CLONIDINE HCL 0.1 MG PO TABS
0.1000 mg | ORAL_TABLET | Freq: Three times a day (TID) | ORAL | Status: DC
  Filled 2013-10-28: qty 1

## 2013-10-28 MED ORDER — SODIUM CHLORIDE 0.9 % IJ SOLN: 3.0000 mL | INTRAMUSCULAR | Status: DC | PRN

## 2013-10-28 NOTE — Progress Notes (Signed)
eLink Physician-Brief Progress Note Patient Name: Peter Dodson DOB: Aug 30, 1980 MRN: [id number]   Date of Service  10/28/2013  HPI/Events of Note   Patient remains hypertensive. I discussed with on-call teaching resident, Dr. Sherrine Maples. They just restarted oral antihypertensives and patient is on nitro GTT.   eICU Interventions   Continue current management.      Intervention Category Intermediate Interventions: Hypertension - evaluation and management  Nathyn Luiz R. 10/28/2013, 6:51 PM

## 2013-10-28 NOTE — ED Notes (Signed)
Josh, PA and PA student in room to assess patient.

## 2013-10-28 NOTE — ED Notes (Addendum)
Pt remains monitored by blood pressure, pulse ox, and 12 lead. Pts family remains at bedside.  

## 2013-10-28 NOTE — ED Notes (Signed)
Patient transported to X-ray 

## 2013-10-28 NOTE — ED Provider Notes (Signed)
Medical screening examination/treatment/procedure(s) were conducted as a shared visit with non-physician practitioner(s) and myself.  I personally evaluated the patient during the encounter.   EKG Interpretation   Date/Time:  Saturday October 28 2013 06:16:15 EDT Ventricular Rate:  85 PR Interval:  177 QRS Duration: 103 QT Interval:  387 QTC Calculation: 460 R Axis:   -9 Text Interpretation:  Sinus rhythm Left atrial enlargement Nonspecific T  abnormalities, lateral leads ST elev, probable normal early repol pattern  No acute changed No new changes Confirmed by Rhunette Croft, MD, ANKIT [phone])  on 10/28/2013 6:33:22 AM     Exertional dyspnea with hypoxia in ED 82-86% when walking; minimal end exp wheezes without rales on exam and no edema; suspect diastolic HF due to HTN  Hurman Horn, MD 11/08/13 1352

## 2013-10-28 NOTE — H&P (Signed)
Pt seen and examined with Dr. Leatha Gilding and Dr.Glenn. Please refer to resident note for details.  In brief, 33 y/o male with PMH of poorly controlled HTN p/w SOB * 1 day. Pt states that he has had intermittent SOB over the last 6 months to a year and was worked up in [place] where he had an ECHO and  Cath and was told his heart was not pumping well. Pt has not taken his hydralazine for the month and is not compliant with his imdur secondary to HA. Last night he was feeling well but woke up at 4 AM with SOB. States he was unab;e to lie flat and also had DOE. No CP, no palpitations, no diaphoresis, no lightheadedness, no syncope, no fevers/chills, no cough, no edema.  Pt also complains of intermittent episodes of nausea, vomiting that are persistent for 12-24 hrs and also admits to marijuana use. Remaining ROS negative  Exam: Cardio: RRR, normal heart sounds Lungs: CTA b/l Abd: soft, non tender, BS+ Ext: No pedal edema Gen: AAO*3, NAD  Assessment and Plan: 33 y/o male with hypertensive urgency and likely new onset HF secondary to hypertensive CM  New onset HF/hypertensive urgency: - Pt with elevated BNP, pulm congestion on x ray - likely HF - Monitor CE. EKG with likely repolarization changes. Will repeat in AM - SBP now elevated to 190s. Restarted clonidine and coreg.  - C/w nitro gtt - check 2 D ECHO - Obtain results from [facility] for last ECHO/cath - c/w tele  Likely CKD: - Baseline Cr of 1.3- 1.5 in 2013 - Cr now 1.61 - likely secondary to HTN - Will check u/a, calculate FeNa  Intermittent n/v: - Possibly secondary to cyclical vomiting syndrome - currently asymptomatic. Will monitor

## 2013-10-28 NOTE — ED Notes (Signed)
Ordered diet tray 

## 2013-10-28 NOTE — Progress Notes (Signed)
MD paged regarding pt's elevated BP despite increasing NTG gtt; orders received for PO medication; pt alert and oriented currently c complaints of headache slight; will continue to closely monitor

## 2013-10-28 NOTE — ED Notes (Signed)
Pt remains monitored by blood pressure, pulse ox, and 12 lead.  

## 2013-10-28 NOTE — H&P (Signed)
Date: 10/28/2013               Patient Name:  Peter Dodson MRN: [id number]  DOB: 04/07/1980 Age / Sex: 33 y.o., male   PCP: Provider Not In System         Medical Service: Internal Medicine Teaching Service         Attending Physician: Dr. Earl Lagos, MD    First Contact: Dr. Eleonore Chiquito Pager: [phone]  Second Contact: Dr. Charlsie Merles Pager: [phone]       After Hours (After 5p/  First Contact Pager: [phone]  weekends / holidays): Second Contact Pager: [phone]   Chief Complaint: "Woke up and couldn't catch my breath early this morning"  History of Present Illness: [name] is a 33 year old man with a history of poorly controlled hypertension that presents with shortness of breath. He was in his usual state of health until this morning at 4AM when he woke up suddenly and felt like he could not catch his breath. He also reports an associated central chest tightness. Had some improvement with sitting up on the side of his bed. Worsened as he walked down the stairs. Patient took a clonidine which did not help his symptoms. Endorses a mild cough; denies radiation of the tightness, chest pain or discomfort, palpitations, lightheadedness, sweating, and nausea. Per report, denies unilateral leg swelling, history of DVT/PE/other blood clots, recent immobilizations, recent surgery, recent travel.   Notes that this has happened a few times over the last 75mo-11yr but at a frequency of less than once per month. Other episodes were similar to this one. Reports that other than these episodes, he sleeps fine throughout the other nights. Reports sleeping on two pillows at baseline. 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 Reports that he has been seen at [facility] last year after having an episode of high blood pressure associated with sweating and malaise. At that time,  reportedly had and ECHO and LHC, which per patient found that his "heart function wasn't normal." Of note he has a history of HTN that has been poorly controlled with 4 agents including carvedilol, clonidine, hydralazine, and lisinopril, but has not taken his hydralazine for the last month (he ran out of it).  When asked about other associated symptoms, he noted that he has occasional 12-24hr episodes where he experiences nausea and vomiting, which has occurred for many years. Other than HTN, does not report other medical problems. He was hospitalized last December at [facility] for hypertensive emergency. Denies family history of heart problems, but does have relatives with HTN and DM on both sides of the family. Has a history of marijuana use, last used about 1-2 months ago. Drinks 1-2 shots of liquor on occasional days. No other illicit drug use.   Meds: Current Facility-Administered Medications  Medication Dose Route Frequency Provider Last Rate Last Dose  . morphine 2 MG/ML injection 2 mg  2 mg Intravenous Q3H PRN Genelle Gather, MD   2 mg at 10/28/13 1513  . nitroGLYCERIN 50 mg in dextrose 5 % 250 mL (0.2 mg/mL) infusion  50 mcg/min Intravenous Titrated Renne Crigler, PA-C 15 mL/hr at 10/28/13 1518 50 mcg/min at 10/28/13 1518  . ondansetron (ZOFRAN) injection 4 mg  4 mg Intravenous Q8H PRN Renne Crigler, PA-C       Current Outpatient Prescriptions  Medication Sig Dispense Refill  . carvedilol (COREG) 25 MG tablet Take 1 tablet (25 mg total) by mouth 2 (two) times daily with a meal.  60 tablet  3  . cloNIDine (CATAPRES) 0.1 MG tablet Take 0.1 mg by mouth 3 (three) times daily.      . hydrALAZINE (APRESOLINE) 100 MG tablet Take 1 tablet (100 mg total) by mouth 3 (three) times daily.  90 tablet  3  . lisinopril (PRINIVIL,ZESTRIL) 20 MG tablet Take 20 mg by mouth 2 (two) times daily.        Allergies: Allergies as of 10/28/2013  . (No Known Allergies)   Past Medical History  Diagnosis Date  .  Hypertension   . Obesity    History reviewed. No pertinent past surgical history. Family History  Problem Relation Age of Onset  . Hypertension Mother   . Hypertension Father   . Diabetes Mother   . Diabetes Father    History   Social History  . Marital Status: Single    Spouse Name: N/A    Number of Children: N/A  . Years of Education: N/A   Occupational History  . Security Guard    Social History Main Topics  . Smoking status: Never Smoker   . Smokeless tobacco: Not on file  . Alcohol Use: 3.5 oz/week    7 drink(s) per week  . Drug Use: Yes    Special: Marijuana     Comment: last used 1 month ago  . Sexual Activity: Not on file   Other Topics Concern  . Not on file   Social History Narrative  . No narrative on file    Review of Systems: Pertinent items are noted in HPI.  Physical Exam: Blood pressure 150/86, pulse 78, temperature 98.8 F (37.1 C), temperature source Oral, resp. rate 18, height  (1.905 m), weight 149.687 kg (330 lb), SpO2 97.00%.  General: WDWN, healthy, cooperative, awake and alert,  no acute distress, woman at bedside Skin: Skin color, texture, turgor normal. No rashes or lesions. HEENT: NCAT, EOMI Cardiovascular: Pulse regular rate and rhythm, no heave, S1/S2 +S4 gallop at LLSB, no murmurs appreciated Pulmonary: normal depth and effort, slightly increased WOB, decreased sounds diffusely, CTA B Abdomen: +BS, Soft, NTND, no masses appreciated  Extremities: Warm and well perfused, no clubbing/cyanosis/edema Neuro: CN III-XII grossly intact, spontaneous movement of all limbs, no obvious deficits  Lab results:   10/28/2013 06:30  Sodium 144  Potassium 4.5  Chloride 106  CO2 28  BUN 23  Creatinine 1.61 (H)  Calcium 8.7  GFR calc non Af Amer 56 (L)  GFR calc Af Amer 64 (L)  Glucose 108 (H)  Anion gap 10  Troponin i, poc 0.01  Pro B Natriuretic peptide (BNP) 4456.0 (H)  WBC 5.3  RBC 4.95  Hemoglobin 11.6 (L)  HCT 34.4 (L)    MCV 69.5 (L)  MCH 23.4 (L)  MCHC 33.7  RDW 18.5 (H)  Platelets 108 (L)   Imaging results:  Dg Chest 2 View  10/28/2013   CLINICAL DATA:  Left chest pain.  Shortness of breath.  EXAM: CHEST  2 VIEW  COMPARISON:  01/23/2012.  FINDINGS: Stable enlarged cardiac silhouette. Mildly prominent pulmonary vasculature and interstitial markings. The lungs are hyperexpanded. Diffuse peribronchial thickening. No discrete airspace consolidation or pleural fluid. Thoracic spine degenerative changes.  IMPRESSION: 1. Cardiomegaly and mild pulmonary vascular congestion. 2. Changes of COPD and chronic bronchitis.   Electronically Signed   By: Brett Canales  Azucena Kuba M.D.   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  Dyspnea: Most likely etiology is new onset heart failure 2/2 hypertensive cardiomyopathy given his orthopnea with central chest tightness, history of similar episodes in the past, poorly controlled hypertension on multiple agents, BNP elevation to 4456 (unknown baseline), and CXR revealing cardiomegaly and mild pulmonary congestion. 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 Has history of hospitalization for hypertensive crisis and BP is 182/109 in ED. Takes 4 agents at home (carvedilol, clonidine, hydralazine, and lisinopril), but reports not taking his hydralazine for the last month. Reports a hospitalization last year at [facility] where he underwent an  ECHO and LHC; states ECHO showed "his heart function was not normal." Differential also includes MI, PE, anxiety attacks, however these are less likely given lack of EKG changes, trop 0.01, lack of chest pain, no s/sx DVT.  - Pt started on nitro gtt triturating to SBP of 140; plan to transition back to PO antihypertensives after BP stabilizes on gtt - furosemide 20 mg IV, strict monitor I/O, daily weights - f/u cycled troponins - f/u ECHO - telemetry - supplemental O2 - obtain ECHO and LHC results from [facility] 2014 admission  HTN: Has history of diffcult to control hypertension now on 4 agents (carvedilol, clonidine, hydralazine, and lisinopril) but has not taken his hydralazine for the last month. Reports taking isosorbide in the past but did not tolerate it due to headaches. On nitro gtt in ED with goal to wean to PO home meds when stably below 140 SBP. - nitro gtt for now with goal to transition to PO  AKI vs newly diagnosed CKD 2/2 hypertensive nephropathy: Recorded baseline of 1.3-1.5 01/2012. Cr 1.61 on this admission. Of note, given patients size (BSA 2.81 meters), a Cr of 1.5 still estimates a healthy GFR (94 mL/min by the MDRD calcuation). New increase in creatinine either represents AKI related to new onset heart failure or newly diagnosed CKD 2/2 hypertensive nephropathy. - tx of HF as above - monitor UOP with strict I's & O's - trend Cr with daily BMET - no acei/arb  Anemia: Microcytic, asymptomatic. Was 10.8-12 on 01/2014 admission; 11.6 on admission. No s/sx of chronic bleeding source. No history of iron studies.  - f/u iron studies (ferritain, serum iron, TIBC)  Nausea and vomiting: Pt reports history of intermittent nausea and vomiting, <1/month. Will last 12-24 hours and then go away. No symptoms recently related to current admission. Potentially due to cyclical vomiting syndrome given marijuana use. Currently asymptomatic. - CTM - Zofran IV PRN  DVT Ppx: - lovenox    Diet: - Heart healthy  Contributions from Juliane Lack, MS4

## 2013-10-28 NOTE — ED Notes (Signed)
O2 went down to 86% while ambulating.

## 2013-10-28 NOTE — Progress Notes (Signed)
Physician notified about continued high blood pressures. Advised to titrate nitro to achieve SBP <140. Will continue to titrate and monitor per order parameters.

## 2013-10-28 NOTE — ED Notes (Signed)
Pt reports around 0400 this morning, a tightness in his left chest woke him up and he was also having some SOB. Pain non-radiating and denies n/v/lightheadedness.

## 2013-10-28 NOTE — ED Provider Notes (Cosign Needed)
CSN: [id number]     Arrival date & time 10/28/13  0612 History   First MD Initiated Contact with Patient 10/28/13 [phone]     Chief Complaint  Patient presents with  . Chest Pain  . Shortness of Breath     (Consider location/radiation/quality/duration/timing/severity/associated sxs/prior Treatment) HPI Comments: Patient with history of intractable hypertension, currently on amlodipine, carvedilol, clonidine, hydralazine (states he ran out), isosorbide mononitrate (however he is non-compliant with this due to headaches) -- presents with c/o chest pain and shortness of breath. Patient was hospitalized in 01/2012 for similar symptoms and hypertensive emergency. Patient states that he gets pains in his left chest every day, but upon waking this morning at 0400 he had significant shortness of breath with his left-sided chest pain. The shortness of breath is what made him worried and brought him to the hospital today. He denies associated palpitations, diaphoresis, nausea or vomiting. Patient took a clonidine which did not make him feel much better. He has occasional cough but no hemoptysis. No lightheadedness or syncope. Patient notes that he was also hospitalized in [place] in the past. Patient denies risk factors for pulmonary embolism including: unilateral leg swelling, history of DVT/PE/other blood clots, recent immobilizations, recent surgery, recent travel (>4hr segment), malignancy, hemoptysis.   The onset of this condition was acute. The course is constant. Aggravating factors: none. Alleviating factors: none.    Patient is a 33 y.o. male presenting with chest pain and shortness of breath. The history is provided by the patient and medical records.  Chest Pain Associated symptoms: cough (just occasionally) and shortness of breath   Associated symptoms: no abdominal pain, no back pain, no diaphoresis, no fever, no nausea, no palpitations and not vomiting   Shortness of Breath Associated  symptoms: chest pain and cough (just occasionally)   Associated symptoms: no abdominal pain, no diaphoresis, no fever, no neck pain, no rash, no vomiting and no wheezing     Past Medical History  Diagnosis Date  . Hypertension   . Obesity    History reviewed. No pertinent past surgical history. Family History  Problem Relation Age of Onset  . Hypertension Mother    History  Substance Use Topics  . Smoking status: Never Smoker   . Smokeless tobacco: Not on file  . Alcohol Use: No    Review of Systems  Constitutional: Negative for fever and diaphoresis.  Eyes: Negative for redness.  Respiratory: Positive for cough (just occasionally), chest tightness and shortness of breath. Negative for wheezing.   Cardiovascular: Positive for chest pain. Negative for palpitations and leg swelling.  Gastrointestinal: Negative for nausea, vomiting and abdominal pain.  Genitourinary: Negative for dysuria.  Musculoskeletal: Negative for back pain and neck pain.  Skin: Negative for rash.  Neurological: Negative for syncope and light-headedness.      Allergies  Review of patient's allergies indicates no known allergies.  Home Medications   Prior to Admission medications   Medication Sig Start Date End Date Taking? Authorizing Provider  amLODipine (NORVASC) 10 MG tablet Take 1 tablet (10 mg total) by mouth daily. 01/24/12   Alison Murray, MD  amLODipine (NORVASC) 10 MG tablet Take 1 tablet (10 mg total) by mouth daily. 01/24/12   Alison Murray, MD  carvedilol (COREG) 25 MG tablet Take 1 tablet (25 mg total) by mouth 2 (two) times daily with a meal. 01/24/12   Alison Murray, MD  carvedilol (COREG) 25 MG tablet Take 1 tablet (25 mg total) by mouth 2 (  two) times daily with a meal. 01/24/12   Alison Murray, MD  cloNIDine (CATAPRES) 0.3 MG tablet Take 1 tablet (0.3 mg total) by mouth 3 (three) times daily. 01/24/12   Alison Murray, MD  cloNIDine (CATAPRES) 0.3 MG tablet Take 1 tablet (0.3 mg total)  by mouth 3 (three) times daily. 01/24/12   Alison Murray, MD  hydrALAZINE (APRESOLINE) 100 MG tablet Take 1 tablet (100 mg total) by mouth 3 (three) times daily. 01/24/12   Alison Murray, MD  hydrALAZINE (APRESOLINE) 100 MG tablet Take 1 tablet (100 mg total) by mouth 3 (three) times daily. 01/24/12   Alison Murray, MD  HYDROcodone-acetaminophen (NORCO) 5-325 MG per tablet Take 1 tablet by mouth every 6 (six) hours as needed for pain. 01/24/12   Alison Murray, MD  HYDROcodone-acetaminophen (NORCO/VICODIN) 5-325 MG per tablet Take 1-2 tablets by mouth every 4 (four) hours as needed. 01/24/12   Alison Murray, MD  levofloxacin (LEVAQUIN) 500 MG tablet Take 1 tablet (500 mg total) by mouth daily. 01/24/12   Alison Murray, MD  levofloxacin (LEVAQUIN) 500 MG tablet Take 1 tablet (500 mg total) by mouth daily. 01/24/12   Alison Murray, MD  magnesium oxide (MAG-OX) 400 MG tablet Take 400 mg by mouth daily.    Historical Provider, MD   BP 175/101  Pulse 76  Temp(Src) 98.8 F (37.1 C) (Oral)  Resp 20  Ht  (1.905 m)  Wt 330 lb (149.687 kg)  BMI 41.25 kg/m2  SpO2 98%  Physical Exam  Nursing note and vitals reviewed. Constitutional: He appears well-developed and well-nourished.  HENT:  Head: Normocephalic and atraumatic.  Mouth/Throat: Oropharynx is clear and moist and mucous membranes are normal. Mucous membranes are not dry.  Eyes: Conjunctivae are normal. Right eye exhibits no discharge. Left eye exhibits no discharge.  Neck: Trachea normal and normal range of motion. Neck supple. Normal carotid pulses and no JVD present. No muscular tenderness present. Carotid bruit is not present. No tracheal deviation present.  Cardiovascular: Normal rate, regular rhythm, S1 normal, S2 normal, normal heart sounds and intact distal pulses.  Exam reveals no distant heart sounds and no decreased pulses.   No murmur heard. Pulmonary/Chest: Effort normal and breath sounds normal. No respiratory distress. He has  no wheezes. He exhibits no tenderness.  Abdominal: Soft. Normal aorta and bowel sounds are normal. There is no tenderness. There is no rebound and no guarding.  Morbidly obese habitus.  Musculoskeletal: He exhibits no edema and no tenderness.  Neurological: He is alert.  Skin: Skin is warm and dry. He is not diaphoretic. No cyanosis. No pallor.  Psychiatric: He has a normal mood and affect.    ED Course  Procedures (including critical care time) Labs Review Labs Reviewed  CBC - Abnormal; Notable for the following:    Hemoglobin 11.6 (*)    HCT 34.4 (*)    MCV 69.5 (*)    MCH 23.4 (*)    RDW 18.5 (*)    Platelets 108 (*)    All other components within normal limits  BASIC METABOLIC PANEL - Abnormal; Notable for the following:    Glucose, Bld 108 (*)    Creatinine, Ser 1.61 (*)    GFR calc non Af Amer 56 (*)    GFR calc Af Amer 64 (*)    All other components within normal limits  PRO B NATRIURETIC PEPTIDE - Abnormal; Notable for the following:    Pro  B Natriuretic peptide (BNP) 4456.0 (*)    All other components within normal limits  Rosezena Sensor, ED    Imaging Review Dg Chest 2 View  10/28/2013   CLINICAL DATA:  Left chest pain.  Shortness of breath.  EXAM: CHEST  2 VIEW  COMPARISON:  01/23/2012.  FINDINGS: Stable enlarged cardiac silhouette. Mildly prominent pulmonary vasculature and interstitial markings. The lungs are hyperexpanded. Diffuse peribronchial thickening. No discrete airspace consolidation or pleural fluid. Thoracic spine degenerative changes.  IMPRESSION: 1. Cardiomegaly and mild pulmonary vascular congestion. 2. Changes of COPD and chronic bronchitis.   Electronically Signed   By: Gordan Payment M.D.   On: 10/28/2013 08:02     EKG Interpretation   Date/Time:  Saturday October 28 2013 06:16:15 EDT Ventricular Rate:  85 PR Interval:  177 QRS Duration: 103 QT Interval:  387 QTC Calculation: 460 R Axis:   -9 Text Interpretation:  Sinus rhythm Left atrial  enlargement Nonspecific T  abnormalities, lateral leads ST elev, probable normal early repol pattern  No acute changed No new changes Confirmed by Rhunette Croft, MD, ANKIT ([id number])  on 10/28/2013 6:33:22 AM      6:47 AM Patient seen and examined. Work-up initiated. Medications ordered. EKG reviewed. It does not appear to be significantly different than 01/2012.   Vital signs reviewed and are as follows: BP 175/101  Pulse 76  Temp(Src) 98.8 F (37.1 C) (Oral)  Resp 20  Ht  (1.905 m)  Wt 330 lb (149.687 kg)  BMI 41.25 kg/m2  SpO2 98%  8:39 AM Patient discussed with Dr. Fonnie Jarvis. Given signs of heart failure will admit. Will start titratable nitro drip and give Lasix (will start with  -- pt is naive to Lasix and has renal insufficiency).   9:00 AM Patient desats into 80's with ambulation.   Spoke with IMTS who will see and admit. Stepdown bed requested.   CRITICAL CARE Performed by: Carolee Rota Total critical care time: 30 Critical care time was exclusive of separately billable procedures and treating other patients. Critical care was necessary to treat or prevent imminent or life-threatening deterioration. Critical care was time spent personally by me on the following activities: development of treatment plan with patient and/or surrogate as well as nursing, discussions with consultants, evaluation of patient's response to treatment, examination of patient, obtaining history from patient or surrogate, ordering and performing treatments and interventions, ordering and review of laboratory studies, ordering and review of radiographic studies, pulse oximetry and re-evaluation of patient's condition.   MDM   Final diagnoses:  Acute congestive heart failure, unspecified congestive heart failure type  Elevated blood pressure   Admit for new onset CHF, hypertension.    Renne Crigler, PA-C 10/28/13 [phone]

## 2013-10-29 DIAGNOSIS — I359 Nonrheumatic aortic valve disorder, unspecified: Secondary | ICD-10-CM

## 2013-10-29 LAB — BASIC METABOLIC PANEL
Anion gap: 12 (ref 5–15)
BUN: 20 mg/dL (ref 6–23)
CALCIUM: 8.5 mg/dL (ref 8.4–10.5)
CO2: 26 mEq/L (ref 19–32)
Chloride: 104 mEq/L (ref 96–112)
Creatinine, Ser: 1.54 mg/dL — ABNORMAL HIGH (ref 0.50–1.35)
GFR calc Af Amer: 68 mL/min — ABNORMAL LOW (ref >90)
GFR, EST NON AFRICAN AMERICAN: 59 mL/min — AB (ref >90)
GLUCOSE: 114 mg/dL — AB (ref 70–99)
Potassium: 3.8 mEq/L (ref 3.7–5.3)
SODIUM: 142 meq/L (ref 137–147)

## 2013-10-29 LAB — VITAMIN B12: Vitamin B-12: 706 pg/mL (ref 211–911)

## 2013-10-29 LAB — FERRITIN: Ferritin: 37 ng/mL (ref 22–322)

## 2013-10-29 LAB — FOLATE: FOLATE: 8.1 ng/mL

## 2013-10-29 LAB — TROPONIN I: Troponin I: <0.3 ng/mL (ref <0.30)

## 2013-10-29 MED ORDER — SPIRONOLACTONE 12.5 MG HALF TABLET
12.5000 mg | ORAL_TABLET | Freq: Every day | ORAL | Status: DC
  Administered 2013-10-29 – 2013-10-30 (×2): 12.5 mg via ORAL
  Filled 2013-10-29 (×2): qty 1

## 2013-10-29 MED ORDER — ENOXAPARIN SODIUM 80 MG/0.8ML ~~LOC~~ SOLN
75.0000 mg | SUBCUTANEOUS | Status: DC
  Administered 2013-10-29 – 2013-10-30 (×2): 75 mg via SUBCUTANEOUS
  Filled 2013-10-29 (×3): qty 0.8

## 2013-10-29 MED ORDER — LISINOPRIL 20 MG PO TABS
20.0000 mg | ORAL_TABLET | Freq: Two times a day (BID) | ORAL | Status: DC
  Administered 2013-10-29 – 2013-10-31 (×5): 20 mg via ORAL
  Filled 2013-10-29 (×6): qty 1

## 2013-10-29 MED ORDER — FUROSEMIDE 10 MG/ML IJ SOLN
40.0000 mg | Freq: Once | INTRAMUSCULAR | Status: AC
  Administered 2013-10-29: 40 mg via INTRAVENOUS
  Filled 2013-10-29: qty 4

## 2013-10-29 MED ORDER — CLONIDINE HCL 0.2 MG PO TABS
0.2000 mg | ORAL_TABLET | Freq: Three times a day (TID) | ORAL | Status: DC
  Administered 2013-10-29 – 2013-10-30 (×4): 0.2 mg via ORAL
  Filled 2013-10-29 (×6): qty 1

## 2013-10-29 MED ORDER — MORPHINE SULFATE 2 MG/ML IJ SOLN
1.0000 mg | Freq: Once | INTRAMUSCULAR | Status: AC
  Administered 2013-10-29: 1 mg via INTRAVENOUS
  Filled 2013-10-29: qty 1

## 2013-10-29 NOTE — Progress Notes (Signed)
Teaching service physicians at bedside. Advised to titrate Nitro for SBP around 160-170 ok. Will continue to monitor for pain.

## 2013-10-29 NOTE — Progress Notes (Signed)
Utilization Review Completed.   Kynzley Dowson, RN, BSN Nurse Case Manager  

## 2013-10-29 NOTE — Progress Notes (Signed)
  Echocardiogram 2D Echocardiogram has been performed.  Arvil Chaco 10/29/2013, 1:42 PM

## 2013-10-29 NOTE — Care Management Note (Signed)
    Page 1 of 1   10/31/2013     10:19:13 AM CARE MANAGEMENT NOTE 10/31/2013  Patient:  Peter Dodson,Peter Dodson   Account Number:  [id number]  Date Initiated:  10/29/2013  Documentation initiated by:  GRAVES-BIGELOW,BRENDA  Subjective/Objective Assessment:   Pt admitted for poorly controlled hypertension that presents with SOB.     Action/Plan:   Pt states he uses [facility]. Pt states he has had issues with purchasing meds. CM did make pt aware of [facility] 4.00 med list for generic meds. CM to provide pt with [facility] information to f/u with PCP. Will monitor.   Anticipated DC Date:  10/31/2013   Anticipated DC Plan:  HOME/SELF CARE      DC Planning Services  [facility]  CM consult      Choice offered to / List presented to:             Status of service:  Completed, signed off Medicare Important Message given?  NO (If response is "NO", the following Medicare IM given date fields will be blank) Date Medicare IM given:   Medicare IM given by:   Date Additional Medicare IM given:   Additional Medicare IM given by:    Discharge Disposition:  HOME/SELF CARE  Per UR Regulation:  Reviewed for med. necessity/level of care/duration of stay  If discussed at Long Length of Stay Meetings, dates discussed:    Comments:  9/22 1016 debbie Monica Zahler rn,bsn spoke w dr Leatha Gilding. pt will be followed in resident clinic for pcp. he has meds that are on 4.00 list. he will take prescriptions today to [facility] to fill after disch. if he has to fill at drug store he sttes apresoline only med that cost 50.00 and with new job can afford that med. he plans on filling his meds today after disch.

## 2013-10-29 NOTE — Progress Notes (Signed)
Pt seen and examined with Dr. Leatha Gilding. I agree with documentation as outlined in her note. - Pt with persistently elevated BP and now with persistent HA likely secondary to nitro gtt. Will attempt to titrate nitro gtt off. Pt started on spironolactone and ACE-I was reintroduced today. Clonidine increased today to 0.2 mg bid.  If BP not controlled will consider cardene gtt - Will need work up for resistant HTN. Will consider renal sono to evaluate for RAS. Will check AM cortisol and renin, aldosterone levels - Will need close outpatient f/u. Will set patient up with [facility] and cardio - F/u ECHO but pt with last EF of 20 %. Will likely need AICD placement - CR stable. Will calculate FeNa re introduce ACE-I and monitor

## 2013-10-29 NOTE — Progress Notes (Signed)
Subjective: Peter Dodson is frustrated that his blood pressure is still high; he also has had a headache all night despite 5 doses of morphine. Denies chest pain or blurred vision.  He did inform the team today that he was supposed to have gotten a life vest defibrillator after his last hospitalization, but it fell through due to his lack of insurance. He acknowledges that he needs a PCP.  Objective: Vital signs in last 24 hours: Filed Vitals:   10/29/13 0645 10/29/13 0700 10/29/13 0706 10/29/13 0730  BP: 181/101 172/99  180/127  Pulse: 84 83 75 68  Temp:   98 F (36.7 C)   TempSrc:   Oral   Resp: Height:      Weight:      SpO2: 98% 98%  99%   Weight change: 2 lb 14.3 oz (1.313 kg)  Intake/Output Summary (Last 24 hours) at 10/29/13 0834 Last data filed at 10/29/13 0600  Gross per 24 hour  Intake 1113.5 ml  Output   3750 ml  Net -2636.5 ml   Physical Exam: General: In no acute distress, sitting up in bed with breakfast at side Skin: Skin color, texture, turgor normal. No rashes or lesions.  HEENT: NCAT, EOMI  Cardiovascular: Pulse regular rate and rhythm, no heave, S1/S2 +S4 gallop at LLSB, no murmurs appreciated  Pulmonary: normal depth and effort, decreased sounds diffusely, CTAB  Abdomen: +BS, Soft, NTND, no masses appreciated  Extremities: Warm and well perfused, no clubbing/cyanosis/edema  Neuro: grossly intact  Lab Results: Basic Metabolic Panel:  Recent Labs Lab 10/28/13 0630 10/29/13 0555  NA 144 142  K 4.5 3.8  CL 106 104  CO2 28 26  GLUCOSE 108* 114*  BUN 23 20  CREATININE 1.61* 1.54*  CALCIUM 8.7 8.5   CBC:  Recent Labs Lab 10/28/13 0630  WBC 5.3  HGB 11.6*  HCT 34.4*  MCV 69.5*  PLT 108*   Cardiac Enzymes:  Recent Labs Lab 10/28/13 1845 10/28/13 2240 10/29/13 0555  TROPONINI <0.30  <0.30 <0.30 <0.30   BNP:  Recent Labs Lab 10/28/13 0631  PROBNP 4456.0*  Anemia Panel:  Recent Labs Lab 10/28/13 1845    RETICCTPCT 1.3   Urine Drug Screen: Drugs of Abuse     Component Value Date/Time   LABOPIA POSITIVE* 01/24/2012 0457   COCAINSCRNUR NONE DETECTED 01/24/2012 0457   LABBENZ NONE DETECTED 01/24/2012 0457   AMPHETMU NONE DETECTED 01/24/2012 0457   THCU POSITIVE* 01/24/2012 0457   LABBARB NONE DETECTED 01/24/2012 0457    Studies/Results: Dg Chest 2 View  10/28/2013   CLINICAL DATA:  Left chest pain.  Shortness of breath.  EXAM: CHEST  2 VIEW  COMPARISON:  01/23/2012.  FINDINGS: Stable enlarged cardiac silhouette. Mildly prominent pulmonary vasculature and interstitial markings. The lungs are hyperexpanded. Diffuse peribronchial thickening. No discrete airspace consolidation or pleural fluid. Thoracic spine degenerative changes.  IMPRESSION: 1. Cardiomegaly and mild pulmonary vascular congestion. 2. Changes of COPD and chronic bronchitis.   Electronically Signed   By: Gordan Payment M.D.   On: 10/28/2013 08:02   Medications: I have reviewed the patient's current medications. Scheduled Meds: . carvedilol  25 mg Oral BID WC  . cloNIDine  0.2 mg Oral TID  . enoxaparin (LOVENOX) injection  75 mg Subcutaneous Q24H  . furosemide  40 mg Intravenous Once  . hydrALAZINE  100 mg Oral TID  . Influenza vac split quadrivalent PF  0.5 mL Intramuscular Tomorrow-1000  . sodium  chloride  3 mL Intravenous Q12H  . spironolactone  12.5 mg Oral Daily   Continuous Infusions: . 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   Heart Failure with reduced EF: Patient has orthopnea and chest tightness along with prior hospitalizations for hypertensive emergency; managed at home on 4 agents, 3 of which he  has been taking. 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 BNP elevation to 4456 (unknown baseline), troponins negative and CXR revealing cardiomegaly and mild pulmonary congestion. Overnight, was on nitro drip, clonidine, hydralazine and carvedilol; had received 40 mg lasix yesterday; still BPs to 225/131 - Titrate down nitro gtt; patient has been having headaches; reassess after following medications are added:  - Furosemide 40 mg IV today  - Start spironolactone  - Start home lisinopril  - Continue clonidine, hydralazine and carvedilol - If BP still uncontrolled, consider diltiazem drip - Ultimately plan to transition to PO medications; can go up on clonidine dose - Strict monitor I/O, daily weights  - F/u ECHO  - Telemetry  - Supplemental O2  - Patient needs PCP; has been lost to f/u on several admissions at OSH; will set him up with resident [facility]  HTN: Has history of resistant hypertension now on 4 agents (carvedilol, clonidine, hydralazine, and lisinopril) but has not taken his hydralazine for the last month. Goal to wean to PO home meds when stably below 140 SBP.  - See above  Likely CKD: Recorded baseline of 1.3-1.5 01/2012. Cr 1.61 on this admission. Of note, given patients size (BSA 2.81 meters), a Cr of 1.5 still estimates a healthy GFR (94 mL/min by the MDRD calcuation). Increase in creatinine either represents AKI related to heart failure or newly diagnosed CKD 2/2 hypertensive nephropathy - HF treatment as above - Trend Cr with daily BMET; reintroducing ACEi today - Urine creatinine/sodium pending; will calculate FeNa  Anemia: Microcytic, asymptomatic. Was 10.8-12 on 01/2014 admission; 11.6 on admission. No s/sx of chronic bleeding source. No history of iron studies.  - Iron studies pending (ferritain, serum iron, TIBC)   Nausea and vomiting: Pt reports history of intermittent nausea and vomiting, <1/month. Will last 12-24 hours and  then go away. No symptoms recently related to current admission. Potentially due to cyclical vomiting syndrome given marijuana use. Currently asymptomatic.  - Zofran IV PRN   DVT Ppx:  - Lovenox   Diet:  - Heart healthy  Dispo: Disposition is deferred at this time, awaiting improvement of current medical problems.  Anticipated discharge in approximately 2-3 day(s).   The patient does not have a current PCP (Provider Not In System) and does need an [facility] hospital follow-up appointment after discharge.  The patient does not have transportation limitations that hinder transportation to clinic appointments.  .Services Needed at time of discharge: Y = Yes, Blank = No PT:   OT:   RN:   Equipment:   Other:     LOS: 1 day   Dionne Ano, MD 10/29/2013, 8:34 AM

## 2013-10-30 DIAGNOSIS — I10 Essential (primary) hypertension: Secondary | ICD-10-CM

## 2013-10-30 DIAGNOSIS — N189 Chronic kidney disease, unspecified: Secondary | ICD-10-CM

## 2013-10-30 DIAGNOSIS — I5043 Acute on chronic combined systolic (congestive) and diastolic (congestive) heart failure: Secondary | ICD-10-CM

## 2013-10-30 LAB — BASIC METABOLIC PANEL
Anion gap: 11 (ref 5–15)
BUN: 19 mg/dL (ref 6–23)
CALCIUM: 8.5 mg/dL (ref 8.4–10.5)
CHLORIDE: 101 meq/L (ref 96–112)
CO2: 27 meq/L (ref 19–32)
Creatinine, Ser: 1.64 mg/dL — ABNORMAL HIGH (ref 0.50–1.35)
GFR calc Af Amer: 63 mL/min — ABNORMAL LOW (ref >90)
GFR calc non Af Amer: 54 mL/min — ABNORMAL LOW (ref >90)
GLUCOSE: 99 mg/dL (ref 70–99)
POTASSIUM: 3.9 meq/L (ref 3.7–5.3)
SODIUM: 139 meq/L (ref 137–147)

## 2013-10-30 LAB — CBC
HEMATOCRIT: 31.9 % — AB (ref 39.0–52.0)
HEMOGLOBIN: 11.1 g/dL — AB (ref 13.0–17.0)
MCH: 23.1 pg — ABNORMAL LOW (ref 26.0–34.0)
MCHC: 34.8 g/dL (ref 30.0–36.0)
MCV: 66.3 fL — AB (ref 78.0–100.0)
PLATELETS: 105 10*3/uL — AB (ref 150–400)
RBC: 4.81 MIL/uL (ref 4.22–5.81)
RDW: 18.1 % — AB (ref 11.5–15.5)
WBC: 4.8 10*3/uL (ref 4.0–10.5)

## 2013-10-30 LAB — IRON AND TIBC
Iron: 39 ug/dL — ABNORMAL LOW (ref 42–135)
SATURATION RATIOS: 11 % — AB (ref 20–55)
TIBC: 371 ug/dL (ref 215–435)
UIBC: 332 ug/dL (ref 125–400)

## 2013-10-30 MED ORDER — HYDRALAZINE HCL 20 MG/ML IJ SOLN: 10.0000 mg | Freq: Four times a day (QID) | INTRAMUSCULAR | Status: DC | PRN

## 2013-10-30 MED ORDER — CLONIDINE HCL 0.3 MG PO TABS
0.3000 mg | ORAL_TABLET | Freq: Three times a day (TID) | ORAL | Status: DC
  Administered 2013-10-30 – 2013-10-31 (×4): 0.3 mg via ORAL
  Filled 2013-10-30 (×5): qty 1

## 2013-10-30 MED ORDER — FUROSEMIDE 20 MG PO TABS
20.0000 mg | ORAL_TABLET | Freq: Every day | ORAL | Status: DC
  Administered 2013-10-30 – 2013-10-31 (×2): 20 mg via ORAL
  Filled 2013-10-30 (×2): qty 1

## 2013-10-30 NOTE — Progress Notes (Signed)
Note saved as wrong format, duplicate. Please see consult note on the same day

## 2013-10-30 NOTE — Progress Notes (Signed)
Subjective: Peter Dodson is feeling "better" this morning. Experienced headache several times overnight, and again now. Denies chest pain, blurred vision, sweaty or any other symptoms.  Information from the patient and Care Everywhere: Patient states that he had a catheterization in the past [facility] 08/2012: admitted for HTN and SOB. TTE showed severe global LV hypokinesis with mild/moderate ventricular hypertrophy and EF 20%. Patient states he was told to get a life vest, but never could because he lost his insurance. [facility] 11/2012: infectious lab testing   Objective: Vital signs in last 24 hours: Filed Vitals:   10/30/13 0300 10/30/13 0400 10/30/13 0422 10/30/13 0500  BP: 138/88 154/102  179/101  Pulse: 66   68  Temp:      TempSrc:      Resp: Height:      Weight:   331 lb 6.4 oz (150.322 kg)   SpO2: 97% 100%  98%   Weight change: -1 lb 7.9 oz (-0.678 kg)  Intake/Output Summary (Last 24 hours) at 10/30/13 0700 Last data filed at 10/30/13 0600  Gross per 24 hour  Intake 2218.5 ml  Output   2900 ml  Net -681.5 ml  Total hospitalization: down ~3 L and ~4 lbs  Physical Exam: General: sitting at the side of the bed, working on computer, in NAD Skin: Skin color, texture, turgor normal. No rashes or lesions  HEENT: NCAT, EOMI  Cardiovascular: Pulse regular rate and rhythm, no heave, S1/S2  Pulmonary: Normal depth and effort, decreased sounds diffusely, CTAB  Abdomen: +BS, soft, NTND, no masses appreciated  Extremities: Warm and well perfused, no clubbing/cyanosis/edema  Neuro: Grossly intact  Lab Results: Basic Metabolic Panel:  Recent Labs Lab 10/29/13 0555 10/30/13 0320  NA 142 139  K 3.8 3.9  CL 104 101  CO2 26 27  GLUCOSE 114* 99  BUN 20 19  CREATININE 1.54* 1.64*  CALCIUM 8.5 8.5   CBC:  Recent Labs Lab 10/28/13 0630 10/30/13 0320  WBC 5.3 4.8  HGB 11.6* 11.1*  HCT 34.4* 31.9*  MCV 69.5* 66.3*  PLT 108* 105*   Cardiac Enzymes:  Recent  Labs Lab 10/28/13 1845 10/28/13 2240 10/29/13 0555  TROPONINI <0.30  <0.30 <0.30 <0.30   BNP:  Recent Labs Lab 10/28/13 0631  PROBNP 4456.0*  Anemia Panel:  Recent Labs Lab 10/28/13 1845  VITAMINB12 706  FOLATE 8.1  FERRITIN 37  RETICCTPCT 1.3   Urine Drug Screen: Drugs of Abuse     Component Value Date/Time   LABOPIA POSITIVE* 01/24/2012 0457   COCAINSCRNUR NONE DETECTED 01/24/2012 0457   LABBENZ NONE DETECTED 01/24/2012 0457   AMPHETMU NONE DETECTED 01/24/2012 0457   THCU POSITIVE* 01/24/2012 0457   LABBARB NONE DETECTED 01/24/2012 0457    Studies/Results: Dg Chest 2 View  10/28/2013   CLINICAL DATA:  Left chest pain.  Shortness of breath.  EXAM: CHEST  2 VIEW  COMPARISON:  01/23/2012.  FINDINGS: Stable enlarged cardiac silhouette. Mildly prominent pulmonary vasculature and interstitial markings. The lungs are hyperexpanded. Diffuse peribronchial thickening. No discrete airspace consolidation or pleural fluid. Thoracic spine degenerative changes.  IMPRESSION: 1. Cardiomegaly and mild pulmonary vascular congestion. 2. Changes of COPD and chronic bronchitis.   Electronically Signed   By: Gordan Payment M.D.   On: 10/28/2013 08:02    Echo 10/29/13: LV EF: 35-40%  Study Conclusions - Left ventricle: The cavity size was moderately dilated. Wall thickness was increased in a pattern of moderate LVH. Systolic function was  moderately reduced. The estimated ejection fraction was in the range of 35% to 40%. Diffuse hypokinesis. Doppler parameters are consistent with abnormal left ventricular relaxation (grade 1 diastolic dysfunction). - Aortic valve: There was mild regurgitation. - Left atrium: The atrium was moderately dilated. - Pericardium, extracardiac: A trivial pericardial effusion was identified.  EKG: Rate 85, NSR, ST elevation in lateral leads that are unchanged from priors.  Medications: I have reviewed the patient's current medications. Scheduled Meds: .  carvedilol  25 mg Oral BID WC  . cloNIDine  0.2 mg Oral TID  . enoxaparin (LOVENOX) injection  75 mg Subcutaneous Q24H  . hydrALAZINE  100 mg Oral TID  . lisinopril  20 mg Oral BID  . sodium chloride  3 mL Intravenous Q12H  . spironolactone  12.5 mg Oral Daily   Continuous Infusions: . 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   Heart Failure with reduced EF: Patient was admitted with orthopnea and chest tightness along with prior hospitalizations for hypertensive emergency; managed at home on 4 agents, 3 of which he has been taking at home (ran out of hydralazine). Dyspnea likely from heart failure due to hypertensive cardiomyopathy (08/2013 at OSH EF 20-25%). BNP elevation to 4456 (unknown baseline), troponins negative, unchanged EKG and CXR revealing cardiomegaly and mild pulmonary congestion. Echo 9/20 showed increased EF to 35-40% with moderate LVH (grade I diastolic dysfunction). Was started on nitro drip up to 200 mcd/mL, clonidine, hydralazine, carvedilol and 40 mg lasix his first 24 hours here; still hypertensive. Nitro down-titrated to ~44mcd/mL due to headaches and spironolactone and lisinopril added to regimen. Patient no longer has headache. On this regimen, BPs have been better controlled 130s/80s to 170s/100s. Total hospitalization: down ~3L and 4lbs. - Cardiology to see today; has been lost to f/u on several hospitalizations and needs outpatient cardiology follow-up - Patient also needs PCP; we will set him up with resident [facility] follow-up - Currently on:  - Nitro ~24mcd/mL; nurse planning to continue to titrate down, then d/c within the next  hour  - Furosemide 40 mg IV   - Spironolactone  - Lisinopril  - Continue clonidine, hydralazine and carvedilol - Continue to taper nitro drip off; room to go up on clonidine, spironolactone or lisinopril (but creatinine slightly increased today) - If BP still uncontrolled today, consider switching to diltiazem drip - Ultimately plan to transition to PO medications - Starting a resistant hypertension workup with renal artery stenosis [place] (pending) and renin, aldosterone labs - Strict monitor I/O, daily weights  - Telemetry  - Supplemental O2   HTN: Has history of resistant hypertension now on 4 home agents (carvedilol, clonidine, hydralazine, and lisinopril) but has not taken his hydralazine for the last month. Goal to wean to PO home meds when stably below 140 SBP.  - See above  Likely CKD: Recorded baseline of 1.31-1.51 last year. Cr 1.61-->1.54-->1.64 on this admission. Of note, given patients size (BSA 2.81 meters), a Cr of 1.5 still estimates a healthy GFR (94 mL/min by the MDRD calcuation). Increase in creatinine either represents AKI related to heart failure or more likely newly diagnosed CKD 2/2 hypertensive nephropathy - HF treatment as above - Trend Cr with  daily BMET; reintroduced ACEi yesterday - Urine creatinine/sodium pending; will calculate FeNa  Anemia: Microcytic, asymptomatic. Was 10.8-12 on 01/2014 admission; 11.6-->11.1 here. No signs or symptoms of chronic bleeding source. No history of iron studies. Ferritin 37, serum iron and TIBC pending  Nausea and vomiting: Pt reports history of intermittent nausea and vomiting, <1/month. Will last 12-24 hours and then go away. No symptoms recently related to current admission. Potentially due to cyclical vomiting syndrome given marijuana use. Currently asymptomatic.  - Zofran IV PRN   DVT Ppx:  - Lovenox   Diet:  - Heart healthy  Dispo: Disposition is deferred at this time, awaiting improvement of current medical problems.   Anticipated discharge in approximately 1-2 day(s).   The patient does not have a current PCP (Provider Not In System) and does need an [facility] hospital follow-up appointment after discharge.  The patient does not have transportation limitations that hinder transportation to clinic appointments.  .Services Needed at time of discharge: Y = Yes, Blank = No PT:   OT:   RN:   Equipment:   Other:     LOS: 2 days   Peter Ano, MD 10/30/2013, 7:00 AM

## 2013-10-30 NOTE — Consult Note (Signed)
Patient's chart reviewed.  I have personally interviewed and examined patient and agree with note as outlined by Azalee Course, PA.  Patient has history of normal coronary arteries by cath in [place] a year ago.  He has a known hypertensive DCM initially with EF 20-25% but now improved to 35-40% by echo this admit.  Recently noted increased DOE.  He has not been taking all of his heart meds (especially the hydralazine) because he lost his job and could not afford it.  He has gotten some of his meds through [facility] $4 program.  He has not tolerated Imdur in the past due to headache.  He was admitted with hypertensive urgency and acute combined systolic/diastolic CHF.  He appears euvolemic at this time by exam.  He has diuresed over 3L.  Marland Kitchen  He does have some T wave inversions in I and aVL that were there before and now has a new T wave inversion in V6 that is probably related to LV strain from severe HTN.  He has had some mild CP with his CHF but cardiac markers are normal and he had a normal cath a year ago.  This is probably related to demand ischemia from hypertensive urgency and CHF.  At this time would stop aldactone and add lasix  daily.  Increase Clonidine to 0.3mg  BID.  Continue current dose of Hydralazine.  Would avoid increasing ACE I further at this time due to mild renal insuff.  Recommend Case Management consult to help patient with cost of meds as well as get patient into [facility] network.  He also gives a history of severe snoring and given his poorly controlled HTN and CHF recommend outpt sleep study.  I will have my nurse set her up.  Signed: Armanda Magic, MD [facility]  10/30/2013

## 2013-10-30 NOTE — Consult Note (Signed)
CARDIOLOGY CONSULT NOTE   Patient ID: Peter Dodson  MRN: [id number], DOB/AGE: 1980-11-03  Admit date: 10/28/2013  Date of Consult: 10/30/2013  Primary Physician: PROVIDER NOT IN SYSTEM  Primary Cardiologist: New - Dr. Mayford Knife   Pt. Profile  33 year old [place] American male with past medical history significant for uncontrolled hypertension, likely chronic kidney disease, and a history of systolic heart failure diagnosed in 2014 presented with acute on chronic HF   Problem List  Past Medical History   Diagnosis  Date   .  Hypertension    .  Obesity     History reviewed. No pertinent past surgical history.   Allergies  No Known Allergies   HPI  The patient is a 33 year old African American male with past medical history significant for uncontrolled hypertension, likely chronic kidney disease, and a history of systolic heart failure diagnosed in 2014. Per record, patient has been admitted multiple times over the last 2 years for hypertensive crisis both at [facility] and at [facility]. According to the patient, while at [facility] in [place] last year, he underwent a stress test followed by cardiac catheterization. He was told he did not have any significant blockages in his coronary artery however his ejection fraction was very low at the time, roughly 20-25%. He was started on multiple blood pressure medications, including Imdur/hydralazine combo. However patient had significant headache associated with Imdur use, and eventually Imdur was taken off his med list and he was discharged on other blood pressure medications including coreg, clonidine and lisinopril. Due to his significant LV dysfunction, patient was also discharged on LifeVest. He wore lifevest for several month. However, he subsequently lost his job and his medical coverage and had to return the life vest earlier this year. According to the patient, he does use marijuana intermittently, however denies any cocaine or amphetamine  use. His last marijuana use was several month ago. He has been only partially compliant with medication and would have problem affording some of his medications especially hydralazine.   He has otherwise been doing well at home. 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 He states, for every hour on the job, he typically walk around on patrol for 15-20 minutes. He denies any significant exertional dyspnea or chest pain. He does have intermittent nausea and vomiting episodes, the last episode was a week ago. He denies any recent fever, chill and cough. He has been doing well until Saturday morning, he woke up in the morning around 4AM feeling unable to breathe. He also felt left-sided chest pressure associated with significant dyspnea. Prompting him to seek medical attention at [facility].  On arrival to [facility] ED, his blood pressure was 175/101. Pulse 76. Pro BNP was greater than 4000. Chest x-ray showed cardiomegaly with mild pulmonary vascular congestion. EKG showed normal sinus rhythm with heart rate 80s, T wave inversion in the lateral leads, mild early report in the anterior lead, no significant ST changes otherwise. Patient was admitted to step down unit and was given 40 mg IV Lasix for 2 days with good diuresis and a -3.3 L net output. Cardiology was consulted for acute on chronic heart failure.   Inpatient Medications  .  carvedilol  25 mg  Oral  BID WC   .  cloNIDine  0.2 mg  Oral  TID   .  enoxaparin (  LOVENOX) injection  75 mg  Subcutaneous  Q24H   .  hydrALAZINE  100 mg  Oral  TID   .  lisinopril  20 mg  Oral  BID   .  sodium chloride  3 mL  Intravenous  Q12H   .  spironolactone  12.5 mg  Oral  Daily    Family History  Family History   Problem  Relation  Age of Onset   .  Hypertension  Mother    .  Hypertension  Father    .  Diabetes  Mother     .  Diabetes  Father     Social History  History    Social History   .  Marital Status:  Single     Spouse Name:  N/A     Number of Children:  N/A   .  Years of Education:  N/A    Occupational History   .  Security Guard     Social History Main Topics   .  Smoking status:  Never Smoker   .  Smokeless tobacco:  Not on file   .  Alcohol Use:  3.5 oz/week     7 drink(s) per week   .  Drug Use:  Yes     Special:  Marijuana      Comment: last used 1 month ago   .  Sexual Activity:  Not on file    Other Topics  Concern   .  Not on file    Social History Narrative   .  No narrative on file    Review of Systems  General: No chills, fever, night sweats or weight changes.  Cardiovascular: No edema, orthopnea, palpitations. +mild chronic dyspnea on exertion, paroxysmal nocturnal dyspnea, 1 episode of chest pressure Sat morning associated with dyspnea  Dermatological: No rash, lesions/masses  Respiratory: No cough, dyspnea  Urologic: No hematuria, dysuria  Abdominal: No diarrhea, bright red blood per rectum, melena, or hematemesis +intermittent nausea/vomiting  Neurologic: No visual changes, wkns, changes in mental status.  All other systems reviewed and are otherwise negative except as noted above.    Physical Exam  Blood pressure 157/65, pulse 74, temperature 98.2 F (36.8 C), temperature source Oral, resp. rate 28, height  (1.905 m), weight 331 lb 6.4 oz (150.322 kg), SpO2 96.00%.  General: Pleasant, NAD  Psych: Normal affect.  Neuro: Alert and oriented X 3. Moves all extremities spontaneously.  HEENT: Normal  Neck: Supple without bruits or JVD.  Lungs: Resp regular and unlabored, CTA.  Heart: RRR no s3, s4, or murmurs.  Abdomen: Soft, non-tender, non-distended, BS + x 4.  Extremities: No clubbing, cyanosis or edema. DP/PT/Radials 2+ and equal bilaterally.  Labs   Recent Labs   10/28/13 1845  10/28/13 2240  10/29/13 0555   TROPONINI  <0.30  <0.30  <0.30   <0.30    Lab Results   Component  Value  Date    WBC  4.8  10/30/2013    HGB  11.1*  10/30/2013    HCT  31.9*  10/30/2013    MCV  66.3*  10/30/2013    PLT  105*  10/30/2013    Recent Labs  Lab  10/30/13 0320   NA  139   K  3.9   CL  101   CO2  27   BUN  19   CREATININE  1.64*   CALCIUM  8.5   GLUCOSE  99    Radiology/Studies  Dg Chest 2 View  10/28/2013 CLINICAL DATA: Left chest pain. Shortness of breath. EXAM: CHEST 2 VIEW COMPARISON: 01/23/2012. FINDINGS: Stable enlarged cardiac silhouette. Mildly prominent pulmonary vasculature and interstitial markings. The lungs are hyperexpanded. Diffuse peribronchial thickening. No discrete airspace consolidation or pleural fluid. Thoracic spine degenerative changes. IMPRESSION: 1. Cardiomegaly and mild pulmonary vascular congestion. 2. Changes of COPD and chronic bronchitis. Electronically Signed By: Gordan Payment M.D. On: 10/28/2013 08:02    ECG  EKG showed normal sinus rhythm with heart rate 80s, T wave inversion in the lateral leads, mild early report in the anterior lead, no significant ST changes otherwise.   ASSESSMENT AND PLAN   1. Acute on chronic combined systolic and diastolic heart failure  - In the setting of poor medical compliance with blood pressure medication  - diuresed well, no JVD, no LE edema, lung clear. -3.3 L so far after 2 dose of  IV lasix  - per patient report, EF previously 20-25%. Now 35-40% on echo. No need for LifeVest as EP >35%  - will change spironolactone to  lasix. Continue lisinopril for now, monitor renal function. Increase clonidine to 0.3mg    2. Chest pressure in the setting of severe dyspnea  - pending report from [facility], if negative cath, would not do further ischemic workup. EKG does shows TWI in lateral leads, however suspicion low for ACS induced   2. Renal insufficiency: likely chronic in the setting of uncontrolled HTN   3. HTN: poorly controlled, no PCP  - case management involved,  will need to be set up with PCP  - has been getting meds from urgent care  - pending renal artery U/S to r/o bilateral renal artery stenosis  4. H/o marijuana use: has not used in several month   5. Possible OSA: outpatient   Signed,  Azalee Course, PA-C  10/30/2013, 12:39 PM

## 2013-10-31 ENCOUNTER — Encounter: Payer: Self-pay | Admitting: Internal Medicine

## 2013-10-31 ENCOUNTER — Encounter (HOSPITAL_COMMUNITY): Payer: Self-pay | Admitting: *Deleted

## 2013-10-31 DIAGNOSIS — R112 Nausea with vomiting, unspecified: Secondary | ICD-10-CM

## 2013-10-31 DIAGNOSIS — D649 Anemia, unspecified: Secondary | ICD-10-CM

## 2013-10-31 LAB — BASIC METABOLIC PANEL
ANION GAP: 9 (ref 5–15)
BUN: 22 mg/dL (ref 6–23)
CHLORIDE: 104 meq/L (ref 96–112)
CO2: 28 mEq/L (ref 19–32)
Calcium: 8.8 mg/dL (ref 8.4–10.5)
Creatinine, Ser: 1.61 mg/dL — ABNORMAL HIGH (ref 0.50–1.35)
GFR calc Af Amer: 64 mL/min — ABNORMAL LOW (ref >90)
GFR calc non Af Amer: 56 mL/min — ABNORMAL LOW (ref >90)
Glucose, Bld: 103 mg/dL — ABNORMAL HIGH (ref 70–99)
Potassium: 4.4 mEq/L (ref 3.7–5.3)
Sodium: 141 mEq/L (ref 137–147)

## 2013-10-31 LAB — SODIUM, URINE, RANDOM: Sodium, Ur: 56 mEq/L

## 2013-10-31 MED ORDER — LISINOPRIL 20 MG PO TABS: 20.0000 mg | ORAL_TABLET | Freq: Two times a day (BID) | ORAL | Status: AC

## 2013-10-31 MED ORDER — CLONIDINE HCL 0.3 MG PO TABS: 0.3000 mg | ORAL_TABLET | Freq: Two times a day (BID) | ORAL | Status: AC

## 2013-10-31 MED ORDER — CARVEDILOL 25 MG PO TABS: 25.0000 mg | ORAL_TABLET | Freq: Two times a day (BID) | ORAL | Status: AC

## 2013-10-31 MED ORDER — HYDRALAZINE HCL 100 MG PO TABS: 100.0000 mg | ORAL_TABLET | Freq: Three times a day (TID) | ORAL | Status: AC

## 2013-10-31 NOTE — Discharge Instructions (Signed)
Please take your medications as prescribed. It is very important that you follow up with the internal medicine clinic and your cardiologist for control of your chronic high blood pressure.   Hypertension Hypertension is another name for high blood pressure. High blood pressure forces your heart to work harder to pump blood. A blood pressure reading has two numbers, which includes a higher number over a lower number (example: 110/72). HOME CARE   Have your blood pressure rechecked by your doctor.  Only take medicine as told by your doctor. Follow the directions carefully. The medicine does not work as well if you skip doses. Skipping doses also puts you at risk for problems.  Do not smoke.  Monitor your blood pressure at home as told by your doctor. GET HELP IF:  You think you are having a reaction to the medicine you are taking.  You have repeat headaches or feel dizzy.  You have puffiness (swelling) in your ankles.  You have trouble with your vision. GET HELP RIGHT AWAY IF:   You get a very bad headache and are confused.  You feel weak, numb, or faint.  You get chest or belly (abdominal) pain.  You throw up (vomit).  You cannot breathe very well. MAKE SURE YOU:   Understand these instructions.  Will watch your condition.  Will get help right away if you are not doing well or get worse. Document Released: 07/15/2007 Document Revised: 01/31/2013 Document Reviewed: 11/18/2012 [facility] Patient Information 2015 [place], [place]. This information is not intended to replace advice given to you by your health care provider. Make sure you discuss any questions you have with your health care provider.

## 2013-10-31 NOTE — Progress Notes (Signed)
CARDIOLOGY CONSULT NOTE   Patient ID: Peter Dodson  MRN: [id number], DOB/AGE: 07-24-1980  Admit date: 10/28/2013  Date of Consult: 10/30/2013  Primary Physician: PROVIDER NOT IN SYSTEM  Primary Cardiologist: New - Dr. Mayford Knife   Pt. Profile  33 year old [place] American male with past medical history significant for uncontrolled hypertension, likely chronic kidney disease, and a history of systolic heart failure diagnosed in 2014 presented with acute on chronic HF   Problem List  Past Medical History   Diagnosis  Date   .  Hypertension    .  Obesity     History reviewed. No pertinent past surgical history.   Allergies  No Known Allergies   HPI  The patient is a 33 year old African American male with past medical history significant for uncontrolled hypertension, likely chronic kidney disease, and a history of systolic heart failure diagnosed in 2014. Per record, patient has been admitted multiple times over the last 2 years for hypertensive crisis both at [facility] and at [place]. According to the patient, while at [facility] in [place] last year, he underwent a stress test followed by cardiac catheterization. He was told he did not have any significant blockages in his coronary artery however his ejection fraction was very low at the time, roughly 20-25%. He was started on multiple blood pressure medications, including Imdur/hydralazine combo. However patient had significant headache associated with Imdur use, and eventually Imdur was taken off his med list and he was discharged on other blood pressure medications including coreg, clonidine and lisinopril. Due to his significant LV dysfunction, patient was also discharged on LifeVest. He wore lifevest for several month. However, he subsequently lost his job and his medical coverage and had to return the life vest earlier this year. According to the patient, he does use marijuana intermittently, however denies any cocaine or amphetamine  use. His last marijuana use was several month ago. He has been only partially compliant with medication and would have problem affording some of his medications especially hydralazine.   He is feeling much better today.    Inpatient Medications  .  carvedilol  25 mg  Oral  BID WC   .  cloNIDine  0.2 mg  Oral  TID   .  enoxaparin (LOVENOX) injection  75 mg  Subcutaneous  Q24H   .  hydrALAZINE  100 mg  Oral  TID   .  lisinopril  20 mg  Oral  BID   .  sodium chloride  3 mL  Intravenous  Q12H   .  spironolactone  12.5 mg  Oral  Daily    Family History  Family History   Problem  Relation  Age of Onset   .  Hypertension  Mother    .  Hypertension  Father    .  Diabetes  Mother    .  Diabetes  Father     Social History  History    Social History   .  Marital Status:  Single     Spouse Name:  N/A     Number of Children:  N/A   .  Years of Education:  N/A    Occupational History   .  Security Guard     Social History Main Topics   .  Smoking status:  Never Smoker   .  Smokeless tobacco:  Not on file   .  Alcohol Use:  3.5 oz/week     7 drink(s) per week   .  Drug Use:  Yes     Special:  Marijuana      Comment: last used 1 month ago   .  Sexual Activity:  Not on file    Other Topics  Concern   .  Not on file    Social History Narrative   .  No narrative on file     Physical Exam  Blood pressure 157/65, pulse 74, temperature 98.2 F (36.8 C), temperature source Oral, resp. rate 28, height  (1.905 m), weight 331 lb 6.4 oz (150.322 kg), SpO2 96.00%.  General: Pleasant, NAD  Psych: Normal affect.  Neuro: Alert and oriented X 3. Moves all extremities spontaneously.  HEENT: Normal  Neck: Supple without bruits or JVD.  Lungs: Resp regular and unlabored, CTA.  Heart: RRR no s3, s4, or murmurs.  Abdomen: Soft, non-tender, non-distended, BS + x 4.  Extremities: No clubbing, cyanosis or edema. DP/PT/Radials 2+ and equal bilaterally.  Labs   Recent Labs    10/28/13 1845  10/28/13 2240  10/29/13 0555   TROPONINI  <0.30  <0.30  <0.30  <0.30    Lab Results   Component  Value  Date    WBC  4.8  10/30/2013    HGB  11.1*  10/30/2013    HCT  31.9*  10/30/2013    MCV  66.3*  10/30/2013    PLT  105*  10/30/2013    Recent Labs  Lab  10/30/13 0320   NA  139   K  3.9   CL  101   CO2  27   BUN  19   CREATININE  1.64*   CALCIUM  8.5   GLUCOSE  99    Radiology/Studies  Dg Chest 2 View  10/28/2013 CLINICAL DATA: Left chest pain. Shortness of breath. EXAM: CHEST 2 VIEW COMPARISON: 01/23/2012. FINDINGS: Stable enlarged cardiac silhouette. Mildly prominent pulmonary vasculature and interstitial markings. The lungs are hyperexpanded. Diffuse peribronchial thickening. No discrete airspace consolidation or pleural fluid. Thoracic spine degenerative changes. IMPRESSION: 1. Cardiomegaly and mild pulmonary vascular congestion. 2. Changes of COPD and chronic bronchitis. Electronically Signed By: Gordan Payment M.D. On: 10/28/2013 08:02    ECG  EKG showed normal sinus rhythm with heart rate 80s, T wave inversion in the lateral leads, mild early report in the anterior lead, no significant ST changes otherwise.   ASSESSMENT AND PLAN   1. Acute on chronic combined systolic and diastolic heart failure  - In the setting of poor medical compliance with blood pressure medication  - diuresed well, no JVD, no LE edema, lung clear.   At this point, I think he could be transferred to tele.  Will leave that up to his primary MD  2. Chest pressure in the setting of severe dyspnea  Enzymes are negative.  Reportedly negative cardiac cath.   2. Renal insufficiency: likely chronic in the setting of uncontrolled HTN   3. HTN: poorly controlled, no PCP  - case management involved, will need to be set up with PCP  - has been getting meds from urgent care  - pending renal artery U/S to r/o bilateral renal artery stenosis  4. H/o marijuana use: has not used in several month    5. Possible OSA: outpatient sleep study   Alvia Grove., MD, [facility] 10/31/2013, 10:12 AM [address]  [address] Office [phone] Pager [phone]

## 2013-10-31 NOTE — Discharge Summary (Signed)
Name: Peter Dodson MRN: [id number] DOB: 21-Sep-1980 33 y.o. PCP: Provider Not In System  Date of Admission: 10/28/2013  6:14 AM Date of Discharge: 10/31/2013 Attending Physician: Earl Lagos, MD  Discharge Diagnosis: Principal Problem:   Acute CHF Active Problems:   Hypertensive urgency   Acute kidney injury   Microcytic anemia   Nausea & vomiting   Congestive heart disease   Acute on chronic combined systolic and diastolic congestive heart failure  Discharge Medications:   Medication List    ASK your doctor about these medications       carvedilol 25 MG tablet  Commonly known as:  COREG  Take 1 tablet (25 mg total) by mouth 2 (two) times daily with a meal.     cloNIDine 0.1 MG tablet  Commonly known as:  CATAPRES  Take 0.1 mg by mouth 3 (three) times daily.     hydrALAZINE 100 MG tablet  Commonly known as:  APRESOLINE  Take 1 tablet (100 mg total) by mouth 3 (three) times daily.     lisinopril 20 MG tablet  Commonly known as:  PRINIVIL,ZESTRIL  Take 20 mg by mouth 2 (two) times daily.        Disposition and follow-up:   [name] was discharged from [facility] in Stable condition.  At the hospital follow up visit please address:  1.  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 He had been taking 3 of his 4 home medications. Prior echo showed EF 20-25% earlier this year, but repeat echo showed improved EF of 35-40% with moderate LVH. His pressures decreased with a nitro drip followed by several manipulations to his medications.   The patient has received care at several hospitals in the past, but will now be followed by the [facility]. He has also been set up with Dr. Mayford Knife for cardiac follow-up. Please consider workup for resistant hypertension (renal artery stenosis [place]).  CKD: Urine creatinine pending, but likely new diagnosis of CKD (creatinine was just  above baseline at 1.61).  Microcytic anemia: Iron studies were performed for the first time at this hospital. Appears to be anemia of chronic disease.   2.  Labs / imaging needed at time of follow-up: BMET to trend creatinine, CBC to follow hemoglobin  3.  Pending labs/ test needing follow-up: Resistant hypertension workup initiated; renin, aldosterone, urine creatinine pending at discharge.   Follow-up Appointments: Ky Barban On 11/08/2013 9:15AM [address] [place] [address] [phone]  Quintella Reichert On 11/10/2013 8:30AM [address] [place] [address] [phone]   Discharge Instructions: Please take your medications as prescribed. It is very important that you follow up with the internal medicine clinic and your cardiologist for control of your chronic high blood pressure.    Hypertension Hypertension is another name for high blood pressure. High blood pressure forces your heart to work harder to pump blood. A blood pressure reading has two numbers, which includes a higher number over a lower number (example: 110/72). HOME CARE   Have your blood pressure rechecked by your doctor.  Only take medicine as told by your doctor. Follow the directions carefully. The medicine does not work as well if you skip doses. Skipping doses also puts you at risk for problems.  Do not smoke.  Monitor your blood pressure at home as told by your doctor. GET HELP IF:  You think you are  having a reaction to the medicine you are taking.  You have repeat headaches or feel dizzy.  You have puffiness (swelling) in your ankles.  You have trouble with your vision. GET HELP RIGHT AWAY IF:   You get a very bad headache and are confused.  You feel weak, numb, or faint.  You get chest or belly (abdominal) pain.  You throw up (vomit).  You cannot breathe very well. MAKE SURE YOU:   Understand these instructions.  Will watch your condition.  Will get  help right away if you are not doing well or get worse. Document Released: 07/15/2007 Document Revised: 01/31/2013 Document Reviewed: 11/18/2012 [facility] Patient Information 2015 [place], [place]. This information is not intended to replace advice given to you by your health care provider. Make sure you discuss any questions you have with your health care provider.   Consultations: Treatment Team:  Rounding Lbcardiology, MD  Procedures Performed:  Dg Chest 2 View  10/28/2013   CLINICAL DATA:  Left chest pain.  Shortness of breath.  EXAM: CHEST  2 VIEW  COMPARISON:  01/23/2012.  FINDINGS: Stable enlarged cardiac silhouette. Mildly prominent pulmonary vasculature and interstitial markings. The lungs are hyperexpanded. Diffuse peribronchial thickening. No discrete airspace consolidation or pleural fluid. Thoracic spine degenerative changes.  IMPRESSION: 1. Cardiomegaly and mild pulmonary vascular congestion. 2. Changes of COPD and chronic bronchitis.   Electronically Signed   By: Gordan Payment M.D.   On: 10/28/2013 08:02    2D Echo: 10/29/13: LV EF: 35-40%  Study Conclusions - Left ventricle: The cavity size was moderately dilated. Wall thickness was increased in a pattern of moderate LVH. Systolic function was moderately reduced. The estimated ejection fraction was in the range of 35% to 40%. Diffuse hypokinesis. Doppler parameters are consistent with abnormal left ventricular relaxation (grade 1 diastolic dysfunction). - Aortic valve: There was mild regurgitation. - Left atrium: The atrium was moderately dilated. - Pericardium, extracardiac: A trivial pericardial effusion was identified.   Admission HPI:  [name] is a 33 year old man with a history of poorly controlled hypertension that presents with shortness of breath. He was in his usual state of health until this morning at 4AM when he woke up suddenly and felt like he could not catch his breath. He also reports an associated  central chest tightness. Had some improvement with sitting up on the side of his bed. Worsened as he walked down the stairs. Patient took a clonidine which did not help his symptoms. Endorses a mild cough; denies radiation of the tightness, chest pain or discomfort, palpitations, lightheadedness, sweating, and nausea. Per report, denies unilateral leg swelling, history of DVT/PE/other blood clots, recent immobilizations, recent surgery, recent travel.   Notes that this has happened a few times over the last 82mo-65yr but at a frequency of less than once per month. Other episodes were similar to this one. Reports that other than these episodes, he sleeps fine throughout the other nights. Reports sleeping on two pillows at baseline. 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 Reports that he has been seen at [facility] last year after having an episode of high blood pressure associated with sweating and malaise. At that time, reportedly had and ECHO and LHC, which per patient found that his "heart function wasn't normal." Of note he has a history of HTN that has been poorly  controlled with 4 agents including carvedilol, clonidine, hydralazine, and lisinopril, but has not taken his hydralazine for the last month (he ran out of it).   When asked about other associated symptoms, he noted that he has occasional 12-24hr episodes where he experiences nausea and vomiting, which has occurred for many years. Other than HTN, does not report other medical problems. He was hospitalized last December at [facility] for hypertensive emergency. Denies family history of heart problems, but does have relatives with HTN and DM on both sides of the family. Has a history of marijuana use, last used about 1-2 months ago. Drinks 1-2 shots of liquor on occasional days. No other illicit drug use.   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   Acute on Chronic Combined Systolic and Diastolic HF: Patient was admitted with orthopnea and chest tightness along with prior hospitalizations for hypertensive emergency; managed at home on 4 agents (lisinopril, clonidine, hydralazine and carvedilol), 3 of which he was taking at home (ran out of hydralazine a month PTA). Dyspnea and EKG V6 T wave inversions on admission were likely from heart failure due to hypertensive cardiomyopathy (08/2013 at OSH EF 20-25%). BNP elevation to 4456 (unknown baseline), troponins negative. CXR revealed cardiomegaly and mild pulmonary congestion. Echo 9/20 showed increased EF to 35-40% with moderate LVH (grade I diastolic dysfunction). Was started on nitro drip up to 200 mcd/mL, clonidine, hydralazine, carvedilol and 40 mg lasix his first 24 hours; still hypertensive. When nitro was eventually tapered off, spironolactone and lisinopril were added to regimen. Then, spironolactone and lasix were removed. Total hospitalization: down ~5.5 L (weights unreliable). Patient has been lost to f/u on several hospitalizations and needs consistent outpatient management with PCP [facility] resident clinic) and cardiologist. Blood pressures were controlled in the 140s/80s at the time of discharge.  Sent home on: - Lisinopril; creatinine stable at 1.61 on discharge - Clonidine (increased from home dose 0.1 mg to 0.3 mg TID)  - Hydralazine (100 mg TID) - Carvedilol (25 mg BID) - Lasix was given in the hospital, but was d/c prior to discharge  HTN: See above  CKD: Recorded baseline of 1.31-1.51 last year. Cr 1.61-->1.54-->1.64-->1.61 on this  admission. Of note, given patients size (BSA 2.81 meters), a Cr of 1.5 still estimates a healthy GFR (94 mL/min by the MDRD calcuation). Increase in creatinine likely represents newly diagnosed CKD 2/2 hypertensive nephropathy. Urine sodium 56; urine creatinine pending at discharge.  Anemia of Chronic Disease: Microcytic, asymptomatic. Was 10.8-12 on 01/2014 admission; 11.6-->11.1 here. No signs or symptoms of chronic bleeding source. No history of iron studies. Ferritin 37, serum iron 39 and TIBC 371 ; suggestive of chronic disease.  Nausea and vomiting: Pt reports history of intermittent nausea and vomiting, <1/month. Will last 12-24 hours and then go away. No symptoms recently related to current admission. Potentially due to cyclical vomiting syndrome given marijuana use. Asymptomatic in the hospital.  Discharge Vitals:   BP 142/88  Pulse 67  Temp(Src) 97.8 F (36.6 C) (Oral)  Resp 23  Ht  (1.905 m)  Wt 330 lb 4 oz (149.8 kg)  BMI 41.28 kg/m2  SpO2 99%  Discharge Labs:  Results for orders placed during the hospital encounter of 10/28/13 (from the past 24 hour(s))  BASIC METABOLIC PANEL     Status: Abnormal   Collection Time    10/31/13  2:12 AM      Result Value Ref Range   Sodium 141  137 - 147 mEq/L   Potassium 4.4  3.7 - 5.3 mEq/L   Chloride 104  96 - 112 mEq/L   CO2 28  19 - 32 mEq/L   Glucose, Bld 103 (*) 70 - 99 mg/dL   BUN 22  6 - 23 mg/dL   Creatinine, Ser 1.61 (*) 0.50 - 1.35 mg/dL   Calcium 8.8  8.4 - 09.6 mg/dL   GFR calc non Af Amer 56 (*) >90 mL/min   GFR calc Af Amer 64 (*) >90 mL/min   Anion gap 9  5 - 15  SODIUM, URINE, RANDOM     Status: None   Collection Time    10/31/13  2:15 AM      Result Value Ref Range   Sodium, Ur 56      Signed: Dionne Ano, MD 10/31/2013, 1:55 PM    Services Ordered on Discharge: none Equipment Ordered on Discharge: none

## 2013-10-31 NOTE — Progress Notes (Signed)
Subjective: Peter Dodson is feeling "very good" this morning. He denies headache, chest pain, blurred vision, perspiration or any other symptoms. He would like double meals ordered in the hospital.   Information from the patient and Care Everywhere: [facility] 08/2012: admitted for HTN and SOB. TTE showed severe global LV hypokinesis with mild/moderate ventricular hypertrophy and EF 20%. Patient states he was told to get a life vest, but never could because he lost his insurance. Normal coronary arteries by catheterization in [place]. [facility] 11/2012: infectious lab testing  Objective: Vital signs in last 24 hours: Filed Vitals:   10/30/13 2312 10/31/13 0354 10/31/13 0500 10/31/13 0810  BP: 136/59 173/84  171/100  Pulse: 72 72    Temp: 98 F (36.7 C) 98.1 F (36.7 C)  98.5 F (36.9 C)  TempSrc: Oral Oral  Oral  Resp: 22 23    Height:      Weight:   330 lb 4 oz (149.8 kg)   SpO2: 100% 100%  99%   Weight change: -1 lb 2.4 oz (-0.522 kg)  Intake/Output Summary (Last 24 hours) at 10/31/13 0949 Last data filed at 10/31/13 0800  Gross per 24 hour  Intake    120 ml  Output   2350 ml  Net  -2230 ml  Total hospitalization: down ~2.2 L and stable weight 330 lbs --> 330 lbs  Physical Exam: General: sitting at the side of the bed, listening to music Skin: Skin color, texture, turgor normal. No rashes or lesions  HEENT: NCAT, EOMI  Cardiovascular: Pulse regular rate and rhythm, no heave, S1/S2, +S4 Pulmonary: Normal depth and effort, decreased sounds diffusely, CTAB  Abdomen: +BS, soft, NTND, no masses appreciated  Extremities: Warm and well perfused, no clubbing/cyanosis/edema  Neuro: Grossly intact  Lab Results: Basic Metabolic Panel:  Recent Labs Lab 10/30/13 0320 10/31/13 0212  NA 139 141  K 3.9 4.4  CL 101 104  CO2 27 28  GLUCOSE 99 103*  BUN 19 22  CREATININE 1.64* 1.61*  CALCIUM 8.5 8.8   CBC:  Recent Labs Lab 10/28/13 0630 10/30/13 0320  WBC 5.3 4.8  HGB  11.6* 11.1*  HCT 34.4* 31.9*  MCV 69.5* 66.3*  PLT 108* 105*   Cardiac Enzymes:  Recent Labs Lab 10/28/13 1845 10/28/13 2240 10/29/13 0555  TROPONINI <0.30  <0.30 <0.30 <0.30   BNP:  Recent Labs Lab 10/28/13 0631  PROBNP 4456.0*  Anemia Panel:  Recent Labs Lab 10/28/13 1845  VITAMINB12 706  FOLATE 8.1  FERRITIN 37  TIBC 371  IRON 39*  RETICCTPCT 1.3   Urine Drug Screen: Drugs of Abuse     Component Value Date/Time   LABOPIA POSITIVE* 01/24/2012 0457   COCAINSCRNUR NONE DETECTED 01/24/2012 0457   LABBENZ NONE DETECTED 01/24/2012 0457   AMPHETMU NONE DETECTED 01/24/2012 0457   THCU POSITIVE* 01/24/2012 0457   LABBARB NONE DETECTED 01/24/2012 0457    Studies/Results: No results found.  Echo 10/29/13: LV EF: 35-40%  Study Conclusions - Left ventricle: The cavity size was moderately dilated. Wall thickness was increased in a pattern of moderate LVH. Systolic function was moderately reduced. The estimated ejection fraction was in the range of 35% to 40%. Diffuse hypokinesis. Doppler parameters are consistent with abnormal left ventricular relaxation (grade 1 diastolic dysfunction). - Aortic valve: There was mild regurgitation. - Left atrium: The atrium was moderately dilated. - Pericardium, extracardiac: A trivial pericardial effusion was identified.  EKG: Rate 85, NSR, T wave inversions in I and aVL (not new)  and new T wave inversions in V6 likely related to LV strain from severe HTN.   Medications: I have reviewed the patient's current medications. Scheduled Meds: . carvedilol  25 mg Oral BID WC  . cloNIDine  0.3 mg Oral TID  . enoxaparin (LOVENOX) injection  75 mg Subcutaneous Q24H  . furosemide  20 mg Oral Daily  . hydrALAZINE  100 mg Oral TID  . lisinopril  20 mg Oral BID  . sodium chloride  3 mL Intravenous Q12H   Continuous Infusions: . 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   Acute on Chronic Combined Systolic and Diastolic HF: Patient was admitted with orthopnea and chest tightness along with prior hospitalizations for hypertensive emergency; managed at home on 4 agents, 3 of which he has been taking at home (ran out of hydralazine). Dyspnea and EKG V6 T wave inversions likely from heart failure due to hypertensive cardiomyopathy (08/2013 at OSH EF 20-25%). BNP elevation to 4456 (unknown baseline), troponins negative. CXR revealing cardiomegaly and mild pulmonary congestion. Echo 9/20 showed increased EF to 35-40% with moderate LVH (grade I diastolic dysfunction). Was started on nitro drip up to 200 mcd/mL, clonidine, hydralazine, carvedilol and 40 mg lasix his first 24 hours here; still hypertensive. Nitro down-titrated off yesterday. Spironolactone and lisinopril have been added to regimen. On this regimen, BPs have been better controlled 130s/80s to 170s/100s and patient no longer has headache. Total hospitalization: down ~5.5 L and 0lbs. - Has been lost to f/u on several hospitalizations and needs outpatient follow up with PCP [facility] resident clinic will take him) and cardiology - Currently on:  - Furosemide 20 mg PO  - Lisinopril; creatinine stable at 1.61 today  - Clonidine (increased from home dose to 0.3 TID)  - Hydralazine  - Carvedilol - Starting a resistant hypertension workup with renal artery stenosis [place] (pending) and renin, aldosterone labs (pending) - Strict monitor I/O, daily weights  - D/c tele - Supplemental O2   HTN: Has history of resistant hypertension now on 4 home agents (carvedilol,  clonidine, hydralazine, and lisinopril) but has not taken his hydralazine for the last month. Goal to wean to PO home meds when stably below 140 SBP.  - See above  Likely CKD: Recorded baseline of 1.31-1.51 last year. Cr 1.61-->1.54-->1.64-->1.61 on this admission. Of note, given patients size (BSA 2.81 meters), a Cr of 1.5 still estimates a healthy GFR (94 mL/min by the MDRD calcuation). Increase in creatinine either represents AKI related to heart failure or more likely newly diagnosed CKD 2/2 hypertensive nephropathy - HF treatment as above - Trend Cr with daily BMET; reintroduced ACEi and creatinine was stable - Urine sodium 56; urine creatinine pending; will calculate FeNa  Anemia: Microcytic, asymptomatic. Was 10.8-12 on 01/2014 admission; 11.6-->11.1 here. No signs or symptoms of chronic bleeding source. No history of iron studies. Ferritin 37, serum iron 39 and TIBC 371 ; suggestive of chronic disease, inflammation, thalassemia.   Nausea and vomiting: Pt reports history of intermittent nausea and vomiting, <1/month. Will last 12-24 hours and then go away. No symptoms recently related to current admission. Potentially due  to cyclical vomiting syndrome given marijuana use. Currently asymptomatic.  - Zofran IV PRN   DVT Ppx:  - Lovenox   Diet:  - Heart healthy  Dispo: Disposition is deferred at this time, awaiting improvement of current medical problems.  Anticipated discharge in approximately 0 day(s).   The patient does not have a current PCP (Provider Not In System) and does need an [facility] hospital follow-up appointment after discharge.  The patient does not have transportation limitations that hinder transportation to clinic appointments.  .Services Needed at time of discharge: Y = Yes, Blank = No PT:   OT:   RN:   Equipment:   Other:     LOS: 3 days   Dionne Ano, MD 10/31/2013, 9:49 AM

## 2013-10-31 NOTE — Progress Notes (Signed)
Pt discharged per written instructions. All d/c orders/instructions/follow up care/follow up appointments/medications discussed with patient. Living with Heart Failure packet given to patient. Contents discussed in depth. Time allowed for questions and concerns. Pt states he has none at this time. Pt able to verbalize the importance of weighing himself, following a low sodium/heart healthy diet, and incorporating exercise into his daily routine. Pt able to state signs and symptoms that would require a doctor's appointment or a call to 911.  Pt states his girlfriend will pick up up sometime this evening around 7 pm. Dr. Leatha Gilding aware of this. Pt not currently being monitored on telemetry, IV has been discontinued.  Asher Muir [place],RN

## 2013-11-05 LAB — ALDOSTERONE + RENIN ACTIVITY W/ RATIO
ALDO / PRA Ratio: 6.4 Ratio (ref 0.9–28.9)
ALDOSTERONE: 6 ng/dL
PRA LC/MS/MS: 0.94 ng/mL/h (ref 0.25–5.82)

## 2013-11-08 ENCOUNTER — Ambulatory Visit: Payer: 59 | Admitting: Internal Medicine

## 2013-11-10 ENCOUNTER — Encounter: Payer: 59 | Admitting: Nurse Practitioner

## 2014-03-18 ENCOUNTER — Encounter (HOSPITAL_COMMUNITY): Payer: Self-pay | Admitting: *Deleted

## 2014-03-18 ENCOUNTER — Emergency Department (HOSPITAL_COMMUNITY)
Admission: EM | Admit: 2014-03-18 | Discharge: 2014-03-18 | Disposition: A | Discharge status: Home / Self Care | Payer: 59 | Attending: Emergency Medicine | Admitting: Emergency Medicine

## 2014-03-18 DIAGNOSIS — E669 Obesity, unspecified: Secondary | ICD-10-CM | POA: Insufficient documentation

## 2014-03-18 DIAGNOSIS — I10 Essential (primary) hypertension: Secondary | ICD-10-CM | POA: Insufficient documentation

## 2014-03-18 DIAGNOSIS — R000 Tachycardia, unspecified: Secondary | ICD-10-CM | POA: Insufficient documentation

## 2014-03-18 DIAGNOSIS — Z4802 Encounter for removal of sutures: Secondary | ICD-10-CM | POA: Insufficient documentation

## 2014-03-18 MED ORDER — LISINOPRIL 20 MG PO TABS: 20.0000 mg | ORAL_TABLET | Freq: Two times a day (BID) | ORAL | Status: AC

## 2014-03-18 MED ORDER — CARVEDILOL 25 MG PO TABS: 25.0000 mg | ORAL_TABLET | Freq: Two times a day (BID) | ORAL | Status: AC

## 2014-03-18 MED ORDER — HYDRALAZINE HCL 100 MG PO TABS: 100.0000 mg | ORAL_TABLET | Freq: Three times a day (TID) | ORAL | Status: AC

## 2014-03-18 MED ORDER — CLONIDINE HCL 0.3 MG PO TABS: 0.3000 mg | ORAL_TABLET | Freq: Two times a day (BID) | ORAL | Status: AC

## 2014-03-18 NOTE — ED Provider Notes (Signed)
CSN: [id number]     Arrival date & time 03/18/14  0622 History   First MD Initiated Contact with Patient 03/18/14 [phone]     Chief Complaint  Patient presents with  . Suture / Staple Removal     (Consider location/radiation/quality/duration/timing/severity/associated sxs/prior Treatment) HPI Comments: Peter Dodson is a 34 y.o. male with a PMHx of HTN and obesity, who presents to the ED with complaints of staple removal. Patient reports that on 03/07/14 he cut himself on glass in [facility] and had 20 staples placed in the emergency department there. Patient states that initially he had some sharp pains in his forearm when he did any repetitive movements, but the pain has resolved at this time. He denies any warmth to the area, drainage, erythema, swelling, or fevers. He states he has been taking Bactrim, and he missed several doses. Of note he does report that he has been out of his blood pressure medicines for "a few days" and does not have a primary care doctor. He denies any chest pain, shortness of breath, vision changes, headache, dizziness, paresthesias, numbness, or weakness.  Patient is a 34 y.o. male presenting with suture removal. The history is provided by the patient. No language interpreter was used.  Suture / Staple Removal This is a new problem. Episode onset: 03/07/14. The problem occurs rarely. The problem has been rapidly improving. Pertinent negatives include no abdominal pain, arthralgias, chest pain, chills, fever, headaches, myalgias, nausea, numbness, vomiting or weakness. Nothing aggravates the symptoms. He has tried oral narcotics for the symptoms. The treatment provided significant relief.    Past Medical History  Diagnosis Date  . Hypertension   . Obesity    Past Surgical History  Procedure Laterality Date  . Knee surgery     Family History  Problem Relation Age of Onset  . Hypertension Mother   . Hypertension Father   . Diabetes Mother   .  Diabetes Father    History  Substance Use Topics  . Smoking status: Never Smoker   . Smokeless tobacco: Not on file  . Alcohol Use: 3.5 oz/week    7 Not specified per week    Review of Systems  Constitutional: Negative for fever and chills.  Eyes: Negative for visual disturbance.  Respiratory: Negative for shortness of breath.   Cardiovascular: Negative for chest pain.  Gastrointestinal: Negative for nausea, vomiting and abdominal pain.  Musculoskeletal: Negative for myalgias and arthralgias.  Skin: Positive for wound (stapled laceration, well healing). Negative for color change.  Neurological: Negative for dizziness, weakness, light-headedness, numbness and headaches.   10 Systems reviewed and are negative for acute change except as noted in the HPI.    Allergies  Review of patient's allergies indicates no known allergies.  Home Medications   Prior to Admission medications   Medication Sig Start Date End Date Taking? Authorizing Provider  carvedilol (COREG) 25 MG tablet Take 1 tablet (25 mg total) by mouth 2 (two) times daily with a meal. 10/31/13  Yes Dionne Ano, MD  cloNIDine (CATAPRES) 0.3 MG tablet Take 1 tablet (0.3 mg total) by mouth 2 (two) times daily. 10/31/13  Yes Dionne Ano, MD  hydrALAZINE (APRESOLINE) 100 MG tablet Take 1 tablet (100 mg total) by mouth 3 (three) times daily. 10/31/13  Yes Dionne Ano, MD  HYDROcodone-acetaminophen (NORCO/VICODIN) 5-325 MG per tablet Take 1-2 tablets by mouth every 4 (four) hours as needed for moderate pain.   Yes Historical Provider, MD  lisinopril (PRINIVIL,ZESTRIL) 20 MG tablet  Take 1 tablet (20 mg total) by mouth 2 (two) times daily. 10/31/13  Yes Dionne AnoJulia Mallory, MD  sulfamethoxazole-trimethoprim (BACTRIM DS,SEPTRA DS) 800-160 MG per tablet Take 1 tablet by mouth 2 (two) times daily. For 10 days   Yes Historical Provider, MD   BP 187/106 mmHg  Pulse 119  Temp(Src) 98.1 F (36.7 C) (Oral)  Resp 18  SpO2 100% Physical Exam   Constitutional: He is oriented to person, place, and time. He appears well-developed and well-nourished.  Non-toxic appearance. No distress.  Obese male, afebrile nontoxic NAD, HTN and tachycardia noted. HR manually found to be 102bpm  HENT:  Head: Normocephalic and atraumatic.  Mouth/Throat: Mucous membranes are normal.  Eyes: Conjunctivae and EOM are normal. Right eye exhibits no discharge. Left eye exhibits no discharge.  Neck: Normal range of motion. Neck supple.  Cardiovascular: Intact distal pulses.  Tachycardia present.   Distal pulses intact in all extremities Mildly tachycardic, by manual check found to be 102 bpm  Pulmonary/Chest: Effort normal. No respiratory distress.  Abdominal: Normal appearance. He exhibits no distension.  Musculoskeletal: Normal range of motion.       Right forearm: He exhibits no tenderness and no swelling.       Arms: Well healed ~15cm scar across volar aspect of R forearm, extending obliquely across from lateral to medial. FROM intact at R elbow and wrist, strength 5/5 in all extremities, distal pulses intact, soft compartments, sensation grossly intact. No erythema or warmth, no induration or fluctuance, no drainage  Neurological: He is alert and oriented to person, place, and time. He has normal strength. No sensory deficit.  Skin: Skin is warm and dry. No rash noted.     Well healed ~15cm scar across volar aspect of R forearm, extending obliquely across from lateral to medial. No erythema or warmth, no induration or fluctuance, no drainage, no tenderness. Small scab located over one area of scar, without purulence or bleeding  Psychiatric: He has a normal mood and affect.  Nursing note and vitals reviewed.   ED Course  SUTURE REMOVAL Date/Time: 03/18/2014 7:16 AM Performed by: Marjean DonnaAMPRUBI-SOMS, Zhi Geier STRUPP Authorized by: Ramond MarrowAMPRUBI-SOMS, Merita Hawks STRUPP Consent: Verbal consent obtained. Risks and benefits: risks, benefits and alternatives were  discussed Consent given by: patient Patient understanding: patient states understanding of the procedure being performed Patient consent: the patient's understanding of the procedure matches consent given Patient identity confirmed: verbally with patient Body area: upper extremity Location details: right lower arm Wound Appearance: clean Staples Removed: 20 Post-removal: dressing applied and antibiotic ointment applied Patient tolerance: Patient tolerated the procedure well with no immediate complications   (including critical care time) Labs Review Labs Reviewed - No data to display  Imaging Review No results found.   EKG Interpretation None      MDM   Final diagnoses:  Encounter for staple removal  HTN (hypertension), benign    34 y.o. male here for suture removal. Clean wound edges, well healed, no signs of infection. Neurovascularly intact with soft compartments. 20 staples removed and bandage applied. Pt found to be tachycardic and hypertensive, which improved slightly after a brief period of rest, admits he's off all antihypertensives since running out. No s/sx of HTN urgency/emergency, therefore no further work up, but agreed to refill his antihypertensives. Discussed that he needs to get a PCP, will provide list of providers in the area. HR was 102 by manual check, which is acceptable. Discussed using antibiotic ointment until the small scab falls off, and f/up for  any s/sx of infection. I explained the diagnosis and have given explicit precautions to return to the ER including for any other new or worsening symptoms. The patient understands and accepts the medical plan as it's been dictated and I have answered their questions. Discharge instructions concerning home care and prescriptions have been given. The patient is STABLE and is discharged to home in good condition.  BP 185/96 mmHg  Pulse 113  Temp(Src) 98.1 F (36.7 C) (Oral)  Resp 22  SpO2 100%  *MANUAL HR: 102  bpm  Meds ordered this encounter  Medications  . carvedilol (COREG) 25 MG tablet    Sig: Take 1 tablet (25 mg total) by mouth 2 (two) times daily with a meal.    Dispense:  60 tablet    Refill:  0    Order Specific Question:  Supervising Provider    Answer:  Eber Hong D [[id number]]  . cloNIDine (CATAPRES) 0.3 MG tablet    Sig: Take 1 tablet (0.3 mg total) by mouth 2 (two) times daily.    Dispense:  60 tablet    Refill:  0    Order Specific Question:  Supervising Provider    Answer:  Eber Hong D [[id number]]  . hydrALAZINE (APRESOLINE) 100 MG tablet    Sig: Take 1 tablet (100 mg total) by mouth 3 (three) times daily.    Dispense:  90 tablet    Refill:  0    Order Specific Question:  Supervising Provider    Answer:  Eber Hong D [[id number]]  . lisinopril (PRINIVIL,ZESTRIL) 20 MG tablet    Sig: Take 1 tablet (20 mg total) by mouth 2 (two) times daily.    Dispense:  60 tablet    Refill:  0    Order Specific Question:  Supervising Provider    Answer:  Vida Roller [address] [place], PA-C 03/18/14 1610  Raeford Razor, MD 03/19/14 [phone]

## 2014-03-18 NOTE — Discharge Instructions (Signed)
Keep wound and clean with mild soap and water. Keep area covered with a topical antibiotic ointment and bandage until the scab fully heals. Always wear sunscreen for the next year on this area. Ice and elevate for additional pain relief and swelling. Alternate between Ibuprofen and Tylenol for additional pain relief. Follow up with your primary care doctor or the [facility] for ongoing care of your wound, and to have your blood pressure medications managed on an ongoing basis. Monitor area for signs of infection to include, but not limited to: increasing pain, redness, drainage/pus, or swelling. Return to emergency department for emergent changing or worsening symptoms.    Scar Minimization You will have a scar anytime you have surgery and a cut is made in the skin or you have something removed from your skin (mole, skin cancer, cyst). Although scars are unavoidable following surgery, there are ways to minimize their appearance. It is important to follow all the instructions you receive from your caregiver about wound care. How your wound heals will influence the appearance of your scar. If you do not follow the wound care instructions as directed, complications such as infection may occur. Wound instructions include keeping the wound clean, moist, and not letting the wound form a scab. Some people form scars that are raised and lumpy (hypertrophic) or larger than the initial wound (keloidal). HOME CARE INSTRUCTIONS   Follow wound care instructions as directed.  Keep the wound clean by washing it with soap and water.  Keep the wound moist with provided antibiotic cream or petroleum jelly until completely healed. Moisten twice a day for about 2 weeks.  Get stitches (sutures) taken out at the scheduled time.  Avoid touching or manipulating your wound unless needed. Wash your hands thoroughly before and after touching your wound.  Follow all restrictions such as limits on exercise  or work. This depends on where your scar is located.  Keep the scar protected from sunburn. Cover the scar with sunscreen/sunblock with SPF 30 or higher.  Gently massage the scar using a circular motion to help minimize the appearance of the scar. Do this only after the wound has closed and all the sutures have been removed.  For hypertrophic or keloidal scars, there are several ways to treat and minimize their appearance. Methods include compression therapy, intralesional corticosteroids, laser therapy, or surgery. These methods are performed by your caregiver. Remember that the scar may appear lighter or darker than your normal skin color. This difference in color should even out with time. SEEK MEDICAL CARE IF:   You have a fever.  You develop signs of infection such as pain, redness, pus, and warmth.  You have questions or concerns. Document Released: 07/16/2009 Document Revised: 04/20/2011 Document Reviewed: 07/16/2009 [facility] Patient Information 2015 [place], [place]. This information is not intended to replace advice given to you by your health care provider. Make sure you discuss any questions you have with your health care provider.  Suture Removal, Care After Refer to this sheet in the next few weeks. These instructions provide you with information on caring for yourself after your procedure. Your health care provider may also give you more specific instructions. Your treatment has been planned according to current medical practices, but problems sometimes occur. Call your health care provider if you have any problems or questions after your procedure. WHAT TO EXPECT AFTER THE PROCEDURE After your stitches (sutures) are removed, it is typical to have the following:  Some discomfort and swelling  in the wound area.  Slight redness in the area. HOME CARE INSTRUCTIONS   If you have skin adhesive strips over the wound area, do not take the strips off. They will fall off on their own  in a few days. If the strips remain in place after 14 days, you may remove them.  Change any bandages (dressings) at least once a day or as directed by your health care provider. If the bandage sticks, soak it off with warm, soapy water.  Apply cream or ointment only as directed by your health care provider. If using cream or ointment, wash the area with soap and water 2 times a day to remove all the cream or ointment. Rinse off the soap and pat the area dry with a clean towel.  Keep the wound area dry and clean. If the bandage becomes wet or dirty, or if it develops a bad smell, change it as soon as possible.  Continue to protect the wound from injury.  Use sunscreen when out in the sun. New scars become sunburned easily. SEEK MEDICAL CARE IF:  You have increasing redness, swelling, or pain in the wound.  You see pus coming from the wound.  You have a fever.  You notice a bad smell coming from the wound or dressing.  Your wound breaks open (edges not staying together). Document Released: 10/21/2000 Document Revised: 11/16/2012 Document Reviewed: 09/07/2012 [facility] Patient Information 2015 [place], [place]. This information is not intended to replace advice given to you by your health care provider. Make sure you discuss any questions you have with your health care provider.  Hypertension Hypertension is another name for high blood pressure. High blood pressure forces your heart to work harder to pump blood. A blood pressure reading has two numbers, which includes a higher number over a lower number (example: 110/72). HOME CARE   Have your blood pressure rechecked by your doctor.  Only take medicine as told by your doctor. Follow the directions carefully. The medicine does not work as well if you skip doses. Skipping doses also puts you at risk for problems.  Do not smoke.  Monitor your blood pressure at home as told by your doctor. GET HELP IF:  You think you are having a  reaction to the medicine you are taking.  You have repeat headaches or feel dizzy.  You have puffiness (swelling) in your ankles.  You have trouble with your vision. GET HELP RIGHT AWAY IF:   You get a very bad headache and are confused.  You feel weak, numb, or faint.  You get chest or belly (abdominal) pain.  You throw up (vomit).  You cannot breathe very well. MAKE SURE YOU:   Understand these instructions.  Will watch your condition.  Will get help right away if you are not doing well or get worse. Document Released: 07/15/2007 Document Revised: 01/31/2013 Document Reviewed: 11/18/2012 [facility] Patient Information 2015 [place], [place]. This information is not intended to replace advice given to you by your health care provider. Make sure you discuss any questions you have with your health care provider.  DASH Eating Plan DASH stands for "Dietary Approaches to Stop Hypertension." The DASH eating plan is a healthy eating plan that has been shown to reduce high blood pressure (hypertension). Additional health benefits may include reducing the risk of type 2 diabetes mellitus, heart disease, and stroke. The DASH eating plan may also help with weight loss. WHAT DO I NEED TO KNOW ABOUT THE DASH EATING  PLAN? For the DASH eating plan, you will follow these general guidelines:  Choose foods with a percent daily value for sodium of less than 5% (as listed on the food label).  Use salt-free seasonings or herbs instead of table salt or sea salt.  Check with your health care provider or pharmacist before using salt substitutes.  Eat lower-sodium products, often labeled as "lower sodium" or "no salt added."  Eat fresh foods.  Eat more vegetables, fruits, and low-fat dairy products.  Choose whole grains. Look for the word "whole" as the first word in the ingredient list.  Choose fish and skinless chicken or [place] more often than red meat. Limit fish, poultry, and meat to 6 oz  (170 g) each day.  Limit sweets, desserts, sugars, and sugary drinks.  Choose heart-healthy fats.  Limit cheese to 1 oz (28 g) per day.  Eat more home-cooked food and less restaurant, buffet, and fast food.  Limit fried foods.  Cook foods using methods other than frying.  Limit canned vegetables. If you do use them, rinse them well to decrease the sodium.  When eating at a restaurant, ask that your food be prepared with less salt, or no salt if possible. WHAT FOODS CAN I EAT? Seek help from a dietitian for individual calorie needs. Grains Whole grain or whole wheat bread. Brown rice. Whole grain or whole wheat pasta. Quinoa, bulgur, and whole grain cereals. Low-sodium cereals. Corn or whole wheat flour tortillas. Whole grain cornbread. Whole grain crackers. Low-sodium crackers. Vegetables Fresh or frozen vegetables (raw, steamed, roasted, or grilled). Low-sodium or reduced-sodium tomato and vegetable juices. Low-sodium or reduced-sodium tomato sauce and paste. Low-sodium or reduced-sodium canned vegetables.  Fruits All fresh, canned (in natural juice), or frozen fruits. Meat and Other Protein Products Ground beef (85% or leaner), grass-fed beef, or beef trimmed of fat. Skinless chicken or [place]. Ground chicken or [place]. Pork trimmed of fat. All fish and seafood. Eggs. Dried beans, peas, or lentils. Unsalted nuts and seeds. Unsalted canned beans. Dairy Low-fat dairy products, such as skim or 1% milk, 2% or reduced-fat cheeses, low-fat ricotta or cottage cheese, or plain low-fat yogurt. Low-sodium or reduced-sodium cheeses. Fats and Oils Tub margarines without trans fats. Light or reduced-fat mayonnaise and salad dressings (reduced sodium). Avocado. Safflower, olive, or canola oils. Natural peanut or almond butter. Other Unsalted popcorn and pretzels. The items listed above may not be a complete list of recommended foods or beverages. Contact your dietitian for more options. WHAT  FOODS ARE NOT RECOMMENDED? Grains White bread. White pasta. White rice. Refined cornbread. Bagels and croissants. Crackers that contain trans fat. Vegetables Creamed or fried vegetables. Vegetables in a cheese sauce. Regular canned vegetables. Regular canned tomato sauce and paste. Regular tomato and vegetable juices. Fruits Dried fruits. Canned fruit in light or heavy syrup. Fruit juice. Meat and Other Protein Products Fatty cuts of meat. Ribs, chicken wings, bacon, sausage, bologna, salami, chitterlings, fatback, hot dogs, bratwurst, and packaged luncheon meats. Salted nuts and seeds. Canned beans with salt. Dairy Whole or 2% milk, cream, half-and-half, and cream cheese. Whole-fat or sweetened yogurt. Full-fat cheeses or blue cheese. Nondairy creamers and whipped toppings. Processed cheese, cheese spreads, or cheese curds. Condiments Onion and garlic salt, seasoned salt, table salt, and sea salt. Canned and packaged gravies. Worcestershire sauce. Tartar sauce. Barbecue sauce. Teriyaki sauce. Soy sauce, including reduced sodium. Steak sauce. Fish sauce. Oyster sauce. Cocktail sauce. Horseradish. Ketchup and mustard. Meat flavorings and tenderizers. Bouillon cubes. Hot sauce. [place]  sauce. Marinades. Taco seasonings. Relishes. Fats and Oils Butter, stick margarine, lard, shortening, ghee, and bacon fat. Coconut, palm kernel, or palm oils. Regular salad dressings. Other Pickles and olives. Salted popcorn and pretzels. The items listed above may not be a complete list of foods and beverages to avoid. Contact your dietitian for more information. WHERE CAN I FIND MORE INFORMATION? National Heart, Lung, and Blood Institute: [employer] Document Released: 01/15/2011 Document Revised: 06/12/2013 Document Reviewed: 11/30/2012 [facility] Patient Information 2015 [place], [place]. This information is not intended to replace advice given to you by your health care  provider. Make sure you discuss any questions you have with your health care provider. Emergency Department Resource Guide 1) Find a Doctor and Pay Out of Pocket Although you won't have to find out who is covered by your insurance plan, it is a good idea to ask around and get recommendations. You will then need to call the office and see if the doctor you have chosen will accept you as a new patient and what types of options they offer for patients who are self-pay. Some doctors offer discounts or will set up payment plans for their patients who do not have insurance, but you will need to ask so you aren't surprised when you get to your appointment.  2) Contact Your Local Health Department Not all health departments have doctors that can see patients for sick visits, but many do, so it is worth a call to see if yours does. If you don't know where your local health department is, you can check in your phone book. The CDC also has a tool to help you locate your state's health department, and many state websites also have listings of all of their local health departments.  3) Find a Walk-in Clinic If your illness is not likely to be very severe or complicated, you may want to try a walk in clinic. These are popping up all over the country in pharmacies, drugstores, and shopping centers. They're usually staffed by nurse practitioners or physician assistants that have been trained to treat common illnesses and complaints. They're usually fairly quick and inexpensive. However, if you have serious medical issues or chronic medical problems, these are probably not your best option.  No Primary Care Doctor: - Call Health Connect at  [phone] - they can help you locate a primary care doctor that  accepts your insurance, provides certain services, etc. - Physician Referral Service- [phone]  Chronic Pain Problems: Organization         Address  Phone   Notes  [facility]  [phone] Patients need to be referred by their primary care doctor.   Medication Assistance: Organization         Address  Phone   Notes  [facility] Medication [facility] [address] [place], [place] [address] [phone] --Must be a resident of [facility] -- Must have NO insurance coverage whatsoever (no Medicaid/ Medicare, etc.) -- The pt. MUST have a primary care doctor that directs their care regularly and follows them in the community   [facility]  [phone]   [employer]  [phone]    Agencies that provide inexpensive medical care: Organization         Address  Phone   Notes  [facility]  [phone]   [facility]    [phone]   [facility] [address] [place],  [place] [phone]   [facility] [address], [place] [phone]   [facility]    [phone]   [facility]    [phone]   [facility]  [address], [place] Phone:  [phone], Fax:  [phone] Hours of Operation:  9 am - 6 pm, M-F.  Also accepts Medicaid/Medicare and self-pay.  [facility]  [address], [place] Phone: [phone], Fax: [phone]. Hours of Operation:  8:30 am - 5:30 pm, M-F.  Also accepts Medicaid and self-pay.  [facility] [place] [address], [place] Phone: [phone]) [phone]   [facility] [address], [place], [place] [phone], Ext. [phone] Mondays & Thursdays: 7-9 AM.  First 15 patients are seen on a first come, first serve basis.    Medicaid-accepting [facility] Providers:  Organization         Address  Phone   Notes  [facility] [address], [place] [phone] Also accepts self-pay patients.  [facility] [address], [place]   [phone]) [phone]   [facility] [address], [place] [phone]) [phone]   [facility] [address] [place] [address], [place] [phone]) [phone]   Renaye RakersVeita Bland [address], [place]   [phone] Only accepts [place] patients after they have their name applied to their card.   Self-Pay (no insurance) in [facility]:  Organization         Address  Phone   Notes  Sickle Cell Patients, [facility] [address], [place] [phone]   [facility] [address], [place] [phone]   [facility]  [address], [place] [phone]   [facility]/Dr. Osei-Bonsu  [address] [place] [address], [place] or [phone], [place] [phone] Phone number for both [place] and [place] locations is the same.  [facility] [address], [place] [phone]   [facility] [place] [address] [place] [address], [place] or [address] [phone]   [facility] [address], [place] [phone]) [phone], phone; [phone], fax Sees patients 1st and 3rd Saturday of every month.  Must not qualify for public or private insurance (i.e. Medicaid, Medicare, [place] Health Choice, Veterans' Benefits)  Household income should be no more than 200% of the poverty level The clinic cannot treat you if you are pregnant or think you are pregnant  Sexually transmitted diseases are not treated at the clinic.

## 2014-03-18 NOTE — ED Notes (Signed)
Pt here to have staples removed from right arm.  Pt reports some pain in right arm and having concerns of infection.  No redness or swelling noted.

## 2014-05-08 ENCOUNTER — Encounter (HOSPITAL_COMMUNITY): Payer: Self-pay | Admitting: Emergency Medicine

## 2014-05-08 ENCOUNTER — Emergency Department (HOSPITAL_COMMUNITY): Payer: Medicaid Other

## 2014-05-08 ENCOUNTER — Observation Stay (HOSPITAL_COMMUNITY)
Admission: EM | Admit: 2014-05-08 | Discharge: 2014-05-11 | Disposition: E | Payer: Medicaid Other | Attending: Oncology | Admitting: Oncology

## 2014-05-08 DIAGNOSIS — D509 Iron deficiency anemia, unspecified: Secondary | ICD-10-CM | POA: Diagnosis not present

## 2014-05-08 DIAGNOSIS — Z79899 Other long term (current) drug therapy: Secondary | ICD-10-CM | POA: Insufficient documentation

## 2014-05-08 DIAGNOSIS — N183 Chronic kidney disease, stage 3 unspecified: Secondary | ICD-10-CM

## 2014-05-08 DIAGNOSIS — F1210 Cannabis abuse, uncomplicated: Secondary | ICD-10-CM | POA: Insufficient documentation

## 2014-05-08 DIAGNOSIS — Z6841 Body Mass Index (BMI) 40.0 and over, adult: Secondary | ICD-10-CM | POA: Insufficient documentation

## 2014-05-08 DIAGNOSIS — F1910 Other psychoactive substance abuse, uncomplicated: Secondary | ICD-10-CM

## 2014-05-08 DIAGNOSIS — I509 Heart failure, unspecified: Secondary | ICD-10-CM | POA: Diagnosis present

## 2014-05-08 DIAGNOSIS — I469 Cardiac arrest, cause unspecified: Secondary | ICD-10-CM | POA: Diagnosis not present

## 2014-05-08 DIAGNOSIS — I5043 Acute on chronic combined systolic (congestive) and diastolic (congestive) heart failure: Secondary | ICD-10-CM

## 2014-05-08 DIAGNOSIS — I129 Hypertensive chronic kidney disease with stage 1 through stage 4 chronic kidney disease, or unspecified chronic kidney disease: Secondary | ICD-10-CM | POA: Insufficient documentation

## 2014-05-08 DIAGNOSIS — Z9114 Patient's other noncompliance with medication regimen: Secondary | ICD-10-CM | POA: Diagnosis not present

## 2014-05-08 DIAGNOSIS — I130 Hypertensive heart and chronic kidney disease with heart failure and stage 1 through stage 4 chronic kidney disease, or unspecified chronic kidney disease: Secondary | ICD-10-CM | POA: Diagnosis not present

## 2014-05-08 DIAGNOSIS — F1290 Cannabis use, unspecified, uncomplicated: Secondary | ICD-10-CM

## 2014-05-08 DIAGNOSIS — I110 Hypertensive heart disease with heart failure: Secondary | ICD-10-CM | POA: Diagnosis not present

## 2014-05-08 DIAGNOSIS — F1010 Alcohol abuse, uncomplicated: Secondary | ICD-10-CM

## 2014-05-08 DIAGNOSIS — D696 Thrombocytopenia, unspecified: Secondary | ICD-10-CM

## 2014-05-08 DIAGNOSIS — R112 Nausea with vomiting, unspecified: Secondary | ICD-10-CM

## 2014-05-08 DIAGNOSIS — R079 Chest pain, unspecified: Principal | ICD-10-CM

## 2014-05-08 DIAGNOSIS — I10 Essential (primary) hypertension: Secondary | ICD-10-CM | POA: Diagnosis not present

## 2014-05-08 DIAGNOSIS — E876 Hypokalemia: Secondary | ICD-10-CM | POA: Diagnosis not present

## 2014-05-08 DIAGNOSIS — F17210 Nicotine dependence, cigarettes, uncomplicated: Secondary | ICD-10-CM | POA: Diagnosis not present

## 2014-05-08 HISTORY — DX: Tobacco use: Z72.0

## 2014-05-08 HISTORY — DX: Chronic kidney disease, stage 3 (moderate): N18.3

## 2014-05-08 HISTORY — DX: Morbid (severe) obesity due to excess calories: E66.01

## 2014-05-08 HISTORY — DX: Chronic kidney disease, stage 3 unspecified: N18.30

## 2014-05-08 HISTORY — DX: Iron deficiency anemia, unspecified: D50.9

## 2014-05-08 HISTORY — DX: Chronic combined systolic (congestive) and diastolic (congestive) heart failure: I50.42

## 2014-05-08 HISTORY — DX: Cannabis abuse, uncomplicated: F12.10

## 2014-05-08 LAB — I-STAT TROPONIN, ED
TROPONIN I, POC: 0 ng/mL (ref 0.00–0.08)
TROPONIN I, POC: 0.02 ng/mL (ref 0.00–0.08)

## 2014-05-08 LAB — URINALYSIS, ROUTINE W REFLEX MICROSCOPIC
BILIRUBIN URINE: NEGATIVE
Glucose, UA: NEGATIVE mg/dL
Hgb urine dipstick: NEGATIVE
Ketones, ur: NEGATIVE mg/dL
Leukocytes, UA: NEGATIVE
NITRITE: NEGATIVE
PH: 6.5 (ref 5.0–8.0)
Protein, ur: NEGATIVE mg/dL
SPECIFIC GRAVITY, URINE: 1.007 (ref 1.005–1.030)
Urobilinogen, UA: 0.2 mg/dL (ref 0.0–1.0)

## 2014-05-08 LAB — CBC WITH DIFFERENTIAL/PLATELET
Basophils Absolute: 0 10*3/uL (ref 0.0–0.1)
Basophils Relative: 0 % (ref 0–1)
EOS PCT: 0 % (ref 0–5)
Eosinophils Absolute: 0 10*3/uL (ref 0.0–0.7)
HEMATOCRIT: 32.1 % — AB (ref 39.0–52.0)
HEMOGLOBIN: 11.2 g/dL — AB (ref 13.0–17.0)
Lymphocytes Relative: 21 % (ref 12–46)
Lymphs Abs: 1.4 10*3/uL (ref 0.7–4.0)
MCH: 23.9 pg — AB (ref 26.0–34.0)
MCHC: 34.9 g/dL (ref 30.0–36.0)
MCV: 68.4 fL — ABNORMAL LOW (ref 78.0–100.0)
Monocytes Absolute: 0.8 10*3/uL (ref 0.1–1.0)
Monocytes Relative: 11 % (ref 3–12)
Neutro Abs: 4.7 10*3/uL (ref 1.7–7.7)
Neutrophils Relative %: 68 % (ref 43–77)
PLATELETS: 121 10*3/uL — AB (ref 150–400)
RBC: 4.69 MIL/uL (ref 4.22–5.81)
RDW: 17.8 % — ABNORMAL HIGH (ref 11.5–15.5)
WBC: 6.9 10*3/uL (ref 4.0–10.5)

## 2014-05-08 LAB — COMPREHENSIVE METABOLIC PANEL
ALBUMIN: 3.2 g/dL — AB (ref 3.5–5.2)
ALK PHOS: 46 U/L (ref 39–117)
ALT: 17 U/L (ref 0–53)
AST: 19 U/L (ref 0–37)
Anion gap: 7 (ref 5–15)
BUN: 17 mg/dL (ref 6–23)
CO2: 28 mmol/L (ref 19–32)
Calcium: 8.7 mg/dL (ref 8.4–10.5)
Chloride: 107 mmol/L (ref 96–112)
Creatinine, Ser: 1.73 mg/dL — ABNORMAL HIGH (ref 0.50–1.35)
GFR calc Af Amer: 59 mL/min — ABNORMAL LOW (ref >90)
GFR calc non Af Amer: 51 mL/min — ABNORMAL LOW (ref >90)
Glucose, Bld: 116 mg/dL — ABNORMAL HIGH (ref 70–99)
POTASSIUM: 3.4 mmol/L — AB (ref 3.5–5.1)
Sodium: 142 mmol/L (ref 135–145)
Total Bilirubin: 0.5 mg/dL (ref 0.3–1.2)
Total Protein: 5.6 g/dL — ABNORMAL LOW (ref 6.0–8.3)

## 2014-05-08 LAB — RAPID URINE DRUG SCREEN, HOSP PERFORMED
AMPHETAMINES: NOT DETECTED
BENZODIAZEPINES: NOT DETECTED
Barbiturates: NOT DETECTED
Cocaine: NOT DETECTED
Opiates: NOT DETECTED
Tetrahydrocannabinol: POSITIVE — AB

## 2014-05-08 LAB — LIPID PANEL
Cholesterol: 139 mg/dL (ref 0–200)
HDL: 56 mg/dL (ref >39)
LDL CALC: 69 mg/dL (ref 0–99)
Total CHOL/HDL Ratio: 2.5 RATIO
Triglycerides: 68 mg/dL (ref <150)
VLDL: 14 mg/dL (ref 0–40)

## 2014-05-08 LAB — TROPONIN I: Troponin I: 0.04 ng/mL — ABNORMAL HIGH (ref <0.031)

## 2014-05-08 LAB — LIPASE, BLOOD: LIPASE: 47 U/L (ref 11–59)

## 2014-05-08 LAB — BRAIN NATRIURETIC PEPTIDE: B NATRIURETIC PEPTIDE 5: 602.9 pg/mL — AB (ref 0.0–100.0)

## 2014-05-08 LAB — TSH: TSH: 1.386 u[IU]/mL (ref 0.350–4.500)

## 2014-05-08 MED ORDER — POTASSIUM CHLORIDE CRYS ER 20 MEQ PO TBCR
30.0000 meq | EXTENDED_RELEASE_TABLET | Freq: Two times a day (BID) | ORAL | Status: DC
  Administered 2014-05-08: 30 meq via ORAL
  Filled 2014-05-08 (×3): qty 1

## 2014-05-08 MED ORDER — HYDRALAZINE HCL 100 MG PO TABS: 100.0000 mg | ORAL_TABLET | Freq: Three times a day (TID) | ORAL | Status: DC

## 2014-05-08 MED ORDER — FUROSEMIDE 10 MG/ML IJ SOLN
60.0000 mg | Freq: Once | INTRAMUSCULAR | Status: AC
  Administered 2014-05-08: 60 mg via INTRAVENOUS

## 2014-05-08 MED ORDER — MORPHINE SULFATE 4 MG/ML IJ SOLN
4.0000 mg | Freq: Once | INTRAMUSCULAR | Status: AC
  Administered 2014-05-08: 4 mg via INTRAVENOUS
  Filled 2014-05-08: qty 1

## 2014-05-08 MED ORDER — ASPIRIN 81 MG PO CHEW
324.0000 mg | CHEWABLE_TABLET | Freq: Once | ORAL | Status: AC
  Administered 2014-05-08: 324 mg via ORAL
  Filled 2014-05-08: qty 4

## 2014-05-08 MED ORDER — CLONIDINE HCL 0.3 MG PO TABS
0.3000 mg | ORAL_TABLET | Freq: Two times a day (BID) | ORAL | Status: DC
  Administered 2014-05-08: 0.3 mg via ORAL
  Filled 2014-05-08 (×3): qty 1

## 2014-05-08 MED ORDER — OXYCODONE-ACETAMINOPHEN 5-325 MG PO TABS
1.0000 | ORAL_TABLET | Freq: Once | ORAL | Status: AC
  Administered 2014-05-08: 1 via ORAL
  Filled 2014-05-08: qty 1

## 2014-05-08 MED ORDER — ONDANSETRON HCL 4 MG/2ML IJ SOLN
4.0000 mg | Freq: Once | INTRAMUSCULAR | Status: AC
Start: 2014-05-08 — End: 2014-05-08
  Administered 2014-05-08: 4 mg via INTRAVENOUS
  Filled 2014-05-08: qty 2

## 2014-05-08 MED ORDER — GI COCKTAIL ~~LOC~~: 30.0000 mL | Freq: Once | ORAL | Status: DC | PRN

## 2014-05-08 MED ORDER — POTASSIUM CHLORIDE CRYS ER 20 MEQ PO TBCR: 40.0000 meq | EXTENDED_RELEASE_TABLET | Freq: Once | ORAL | Status: DC

## 2014-05-08 MED ORDER — MORPHINE SULFATE 2 MG/ML IJ SOLN
1.0000 mg | INTRAMUSCULAR | Status: DC | PRN
  Administered 2014-05-08 (×2): 1 mg via INTRAVENOUS
  Filled 2014-05-08 (×3): qty 1

## 2014-05-08 MED ORDER — MORPHINE SULFATE 4 MG/ML IJ SOLN
4.0000 mg | Freq: Once | INTRAMUSCULAR | Status: AC
Start: 2014-05-08 — End: 2014-05-08
  Administered 2014-05-08: 4 mg via INTRAVENOUS
  Filled 2014-05-08: qty 1

## 2014-05-08 MED ORDER — HYDRALAZINE HCL 50 MG PO TABS
100.0000 mg | ORAL_TABLET | Freq: Three times a day (TID) | ORAL | Status: DC
  Administered 2014-05-08 – 2014-05-09 (×2): 100 mg via ORAL
  Filled 2014-05-08 (×6): qty 2

## 2014-05-08 MED ORDER — GI COCKTAIL ~~LOC~~
30.0000 mL | Freq: Once | ORAL | Status: AC
  Administered 2014-05-08: 30 mL via ORAL
  Filled 2014-05-08: qty 30

## 2014-05-08 MED ORDER — FUROSEMIDE 10 MG/ML IJ SOLN
60.0000 mg | Freq: Once | INTRAMUSCULAR | Status: DC
  Filled 2014-05-08: qty 6

## 2014-05-08 MED ORDER — NITROGLYCERIN 0.4 MG SL SUBL
0.4000 mg | SUBLINGUAL_TABLET | SUBLINGUAL | Status: DC | PRN
  Administered 2014-05-08 (×2): 0.4 mg via SUBLINGUAL
  Filled 2014-05-08: qty 1

## 2014-05-08 MED ORDER — ONDANSETRON HCL 4 MG/2ML IJ SOLN: 4.0000 mg | Freq: Four times a day (QID) | INTRAMUSCULAR | Status: DC | PRN

## 2014-05-08 MED ORDER — CARVEDILOL 25 MG PO TABS
25.0000 mg | ORAL_TABLET | Freq: Two times a day (BID) | ORAL | Status: DC
  Administered 2014-05-09: 25 mg via ORAL
  Filled 2014-05-08 (×3): qty 1

## 2014-05-08 MED ORDER — ACETAMINOPHEN 500 MG PO TABS
500.0000 mg | ORAL_TABLET | Freq: Four times a day (QID) | ORAL | Status: DC | PRN
  Administered 2014-05-08: 500 mg via ORAL
  Filled 2014-05-08 (×2): qty 1

## 2014-05-08 MED ORDER — IPRATROPIUM-ALBUTEROL 0.5-2.5 (3) MG/3ML IN SOLN: 3.0000 mL | RESPIRATORY_TRACT | Status: DC | PRN

## 2014-05-08 MED ORDER — POTASSIUM CHLORIDE CRYS ER 20 MEQ PO TBCR
40.0000 meq | EXTENDED_RELEASE_TABLET | Freq: Once | ORAL | Status: AC
  Administered 2014-05-08: 40 meq via ORAL
  Filled 2014-05-08: qty 2

## 2014-05-08 MED ORDER — HEPARIN SODIUM (PORCINE) 5000 UNIT/ML IJ SOLN
5000.0000 [IU] | Freq: Three times a day (TID) | INTRAMUSCULAR | Status: DC
  Administered 2014-05-08 – 2014-05-09 (×2): 5000 [IU] via SUBCUTANEOUS
  Filled 2014-05-08 (×4): qty 1

## 2014-05-08 MED ORDER — PANTOPRAZOLE SODIUM 40 MG PO TBEC
40.0000 mg | DELAYED_RELEASE_TABLET | Freq: Every day | ORAL | Status: DC
  Administered 2014-05-08: 40 mg via ORAL
  Filled 2014-05-08: qty 1

## 2014-05-08 NOTE — H&P (Signed)
Date: 2014/05/20               Patient Name:  Peter Dodson MRN: [id number]  DOB: [birth year] Age / Sex: 34 y.o., male   PCP: Provider Not In System         Medical Service: Internal Medicine Teaching Service         Attending Physician: Dr. Levert Feinstein, MD    First Contact: Dr. Senaida Ores Pager: [phone]  Second Contact: Dr. Mariea Clonts Pager: [phone]       After Hours (After 5p/  First Contact Pager: [phone]  weekends / holidays): Second Contact Pager: [phone]   Chief Complaint: chest pain  History of Present Illness: 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 Yesterday, patient had 10 episodes of non-bloody non-bilious vomiting and nausea which resolved on its own. Patient denied any fevers, chills, or diarrhea associated with vomiting. He states this has happened to him about 15 times in the last 4 years. He has not had it evaluated before. It does admit to frequent daily marijuana use and is unsure if his vomiting is related. He denies any abdominal pain associated. He occasionally has odynophagia, but infrequently. He denies dysphagia. He denies reflux or history of gastric ulcers. This episode of nausea and vomiting resolved on its own last night. Patient has been tolerating po foods and fluid intake.  This morning while at work around 0400 am, patient developed substernal and left sided chest pain with radiation to the neck. Pain was constant and severe at a 7-8/10. Nothing seemed to make the pain better although he tried tylenol for it. Movement, eating and deep inspiration made the pain worse. Pain persisted for several hours until patient decided to come into the ED when it did not resolve. Chest pain was not associated with diaphoresis, shortness of breath, nausea, vomiting, lightheadedness, or syncope. Currently, pain is a 6/10.    Patient does have risk factors of HTN and CHF. He admits to daily marijuana use, but denies tobacco use. He drinks 4-5 shots of alcohol twice a week. He denies other illicit drug use. He has been compliant with his medications. His last exercise stress test and cath were in 2014 and patient reports the cath was normal. He denies any family history of heart disease. He denies history of anxiety or reflux. He does have DOE and chest pain at baseline with exertion, but nothing like this before.   Meds: Current Facility-Administered Medications  Medication Dose Route Frequency Provider Last Rate Last Dose  . gi cocktail (Maalox,Lidocaine,Donnatal)  30 mL Oral Once Aalijah Lanphere Dulcy Fanny, MD      . nitroGLYCERIN (NITROSTAT) SL tablet 0.4 mg  0.4 mg Sublingual Q5 min PRN Tatyana Kirichenko, PA-C   0.4 mg at May 20, 2014 0947  . pantoprazole (PROTONIX) EC tablet 40 mg  40 mg Oral Daily Kaija Kovacevic Dulcy Fanny, MD      . potassium chloride SA (K-DUR,KLOR-CON) CR tablet 40 mEq  40 mEq Oral Once Laurann Montana, PA-C       Current Outpatient Prescriptions  Medication Sig Dispense Refill  . acetaminophen (TYLENOL) 500 MG tablet Take 500 mg by mouth every 6 (six) hours as needed for mild pain or moderate pain (Pt took  this morning).    . carvedilol (COREG) 25 MG tablet Take 1 tablet (25 mg total)  by mouth 2 (two) times daily with a meal. 60 tablet 0  . cloNIDine (CATAPRES) 0.3 MG tablet Take 1 tablet (0.3 mg total) by mouth 2 (two) times daily. 60 tablet 0  . hydrALAZINE (APRESOLINE) 100 MG tablet Take 1 tablet (100 mg total) by mouth 3 (three) times daily. 90 tablet 0  . HYDROcodone-acetaminophen (NORCO/VICODIN) 5-325 MG per tablet Take 1-2 tablets by mouth every 4 (four) hours as needed for moderate pain.    Marland Kitchen lisinopril (PRINIVIL,ZESTRIL) 20 MG tablet Take 1 tablet (20 mg total) by mouth 2 (two) times daily. 60 tablet 0  . carvedilol (COREG) 25 MG tablet Take 1 tablet (25 mg total) by mouth 2 (two) times daily  with a meal. 60 tablet 0  . cloNIDine (CATAPRES) 0.3 MG tablet Take 1 tablet (0.3 mg total) by mouth 2 (two) times daily. 90 tablet 0  . hydrALAZINE (APRESOLINE) 100 MG tablet Take 1 tablet (100 mg total) by mouth 3 (three) times daily. 90 tablet 3  . lisinopril (PRINIVIL,ZESTRIL) 20 MG tablet Take 1 tablet (20 mg total) by mouth 2 (two) times daily. 60 tablet 0    Allergies: Allergies as of 04/18/2014  . (No Known Allergies)   Past Medical History  Diagnosis Date  . Hypertension   . Obesity   . Chronic combined systolic and diastolic CHF (congestive heart failure)   . Tobacco abuse   . Morbid obesity   . CKD (chronic kidney disease), stage III   . Marijuana abuse   . Microcytic anemia    Past Surgical History  Procedure Laterality Date  . Knee surgery     Family History  Problem Relation Age of Onset  . Hypertension Mother   . Hypertension Father   . Diabetes Mother   . Diabetes Father    History   Social History  . Marital Status: Single    Spouse Name: N/A  . Number of Children: N/A  . Years of Education: N/A   Occupational History  . Security Guard    Social History Main Topics  . Smoking status: Former Games developer  . Smokeless tobacco: Not on file  . Alcohol Use: 0.0 oz/week    0 Standard drinks or equivalent per week     Comment: several days a week - drinks 5-7 standard size liquor shots  . Drug Use: Yes    Special: Marijuana     Comment: Every day  . Sexual Activity: Not on file   Other Topics Concern  . Not on file   Social History Narrative    Review of Systems: General: Admits to weight gain. Denies fever, chills, fatigue, change in appetite and diaphoresis.  Respiratory: Admits to DOE (chronic). Denies SOB, cough, chest tightness, and wheezing.   Cardiovascular: Admits to chest pain but denies palpitations.  Gastrointestinal: Admits to nausea and vomiting (yesterday, none currently). Denies abdominal pain, diarrhea, constipation, and abdominal  distention.  Musculoskeletal: Denies swelling.   Neurological: Denies dizziness, headaches, weakness, lightheadedness Psychiatric/Behavioral: Denies anxiety  Physical Exam: Filed Vitals:   05/07/2014 1415 04/19/2014 1445 04/29/2014 1640 04/18/2014 1807  BP: 150/86 147/66 149/70 140/71  Pulse: 72 58 64 68  Temp:      TempSrc:      Resp: 18  17 15   Height:      Weight:      SpO2: 100% 100% 100% 100%   General: Vital signs reviewed.  Patient is well-developed and well-nourished, obese, in no acute distress and cooperative with exam.  Neck: No obvious JVD Cardiovascular: RRR, 3/6 diastolic murmur heard best in Tricuspid area Pulmonary/Chest: Clear to auscultation bilaterally, no wheezes, rales, or rhonchi. Pain not reproducible with palpation. Abdominal: Soft, non-tender, non-distended, BS +, no guarding present.  Extremities: No lower extremity edema bilaterally Neurological: A&O x3 Psychiatric: Normal mood and affect. speech and behavior is normal. Cognition and memory are normal.   Lab results: Basic Metabolic Panel:  Recent Labs  16/10/96 0929  NA 142  K 3.4*  CL 107  CO2 28  GLUCOSE 116*  BUN 17  CREATININE 1.73*  CALCIUM 8.7   Liver Function Tests:  Recent Labs  05-13-2014 0929  AST 19  ALT 17  ALKPHOS 46  BILITOT 0.5  PROT 5.6*  ALBUMIN 3.2*    Recent Labs  2014-05-13 0929  LIPASE 47   CBC:  Recent Labs  May 13, 2014 0929  WBC 6.9  NEUTROABS 4.7  HGB 11.2*  HCT 32.1*  MCV 68.4*  PLT 121*   Urine Drug Screen: Drugs of Abuse     Component Value Date/Time   LABOPIA NONE DETECTED 05-13-14 1645   COCAINSCRNUR NONE DETECTED 2014-05-13 1645   LABBENZ NONE DETECTED May 13, 2014 1645   AMPHETMU NONE DETECTED 05/13/2014 1645   THCU POSITIVE* 05/13/14 1645   LABBARB NONE DETECTED 05/13/14 1645    Urinalysis:  Recent Labs  05/13/14 1645  COLORURINE YELLOW  LABSPEC 1.007  PHURINE 6.5  GLUCOSEU NEGATIVE  HGBUR NEGATIVE  BILIRUBINUR NEGATIVE    KETONESUR NEGATIVE  PROTEINUR NEGATIVE  UROBILINOGEN 0.2  NITRITE NEGATIVE  LEUKOCYTESUR NEGATIVE   Imaging results:  Dg Chest 2 View  13-May-2014   CLINICAL DATA:  34 year old male with midsternal chest pain radiating to the left. Increased pain with inspiration. Initial encounter. Current history of smoking.  EXAM: CHEST  2 VIEW  COMPARISON:  10/28/2013 and earlier.  FINDINGS: Chronic cardiomegaly. Other mediastinal contours are within normal limits. Basilar predominant increased interstitial markings. No pneumothorax. No consolidation. No definite pleural effusion. No acute osseous abnormality identified.  IMPRESSION: Chronic cardiomegaly. Basilar predominant increased interstitial markings favor related to vascular congestion. Consider mild/developing interstitial edema.   Electronically Signed   By: Odessa Fleming M.D.   On: 13-May-2014 10:21   Other results: EKG: Normal sinus rhythm. TWI in leads I, aVL, aVR and flattening in V6. Consistent with prior. Left axis deviation and LVH.  Assessment & Plan by Problem: Principal Problem:   Chest pain Active Problems:   Microcytic anemia   Nausea & vomiting   Acute on chronic combined systolic and diastolic congestive heart failure   Hypertensive heart disease with CHF   CKD (chronic kidney disease), stage III   Hypertension   Hypokalemia   Alcohol abuse   Marijuana use, episodic   Morbid obesity   Thrombocytopenia  Chest Pain: Substernal, radiating to neck, severe and constant without relieving factors but without associated symptoms. Vital signs on admission showed afebrile 98.3, hypertensive at 183/106, tachycardic at 119, RR 18, satting 100% on room air. Labs were without leukocytosis at 6.9. Poc troponin negative x 2. BNP elevated at 602.9 without priors. CXR showed chronic cardiomegaly, asilar predominant increased interstitial markings favor related to vascular congestion and possible mild/developing interstitial edema. Risk factors include  HTN, obesity, CHF. Heart score of 2 giving 0.9-1.7% risk of MACE. Differential includes ACS, unstable angina, GI related (esophageal erosion, GERD), or volume overload. Patient was seen by cardiology who doubted pain was ischemic related and recommended repeat Echo. Patient received ASA 325 mg in  the ED and morphine 4 mg once.  -Cardiology following, appreciate recommendations -Nitroglycerin 0.4 mg SL Q655min prn -Carvedilol 25 mg BID -Zofran 4 mg Q6H prn  -Protonix 40 mg daily -GI cocktail once  -Trend troponins -Repeat EKG tomorrow am -Telemetry -Repeat CBC/BMET tomorrow am -Repeat Echo -Lipid panel -HgbA1c  -TSH/Free T4  HTN: Hypertensive on admission at 187/106. Patient is on carvedilol 25 mg BID, clonidine 0.3 mg BID, hydralazine 100 mg TID, lisinopril 20 mg BID at home with which he is compliant. Given persistent hypertension despite compliance and given his young age, we will order a renal artery duplex.  -Continue Carvedilol 25 mg BID -Continue Clonidine 0.3 mg BID -Continue Hydralazine 100 mg TID -Hold Lisinopril until tomorrow morning -Renal artery duplex ultrasound  Combined Chronic Grade 1 Diastolic and Systolic CHF: Last echo in September 2015 showed EF of 35-40% with mild aortic regurgitation. Patient does not appear volume overloaded on exam, but BNP elevated at 602.9 and CXR showed chronic cardiomegaly, asilar predominant increased interstitial markings favor related to vascular congestion and possible mild/developing interstitial edema. Patient was given Lasix 60 mg IV in the ED.  -Repeat echocardiogram -Repeat BMET tomorrow am  CKD III: Creatinine 1.7, baseline appears to be 1.5-1.6.  -Repeat BMET tomorrow morning  Substance Abuse: Patient admits to daily marijuana use and alcohol use with 3-4 shots twice a week. UDS only positive for THC. 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  -Counseled on abstinence  DVT/PE ppx: Heparin SQ  TID  FEN: Cardiac Diet  Code: FULL  Dispo: Disposition is deferred at this time, awaiting improvement of current medical problems. Anticipated discharge in approximately 1-2 day(s).   The patient does not have a current PCP (Provider Not In System) and [facility] after discharge.  The patient does not have transportation limitations that hinder transportation to clinic appointments.  Signed: Jill AlexandersAlexa Richardson, DO PGY-1 Internal Medicine Resident Pager # [phone] 04/11/2014 6:54 PM

## 2014-05-08 NOTE — Consult Note (Signed)
Cardiology Consultation Note  Patient ID: Peter Dodson, MRN: [id number], DOB/AGE: 1980-04-03 34 y.o. Admit date: 2014/05/27   Date of Consult: May 27, 2014 Primary Physician: PROVIDER NOT IN SYSTEM Primary Cardiologist: Dr. Mayford Knife  Chief Complaint: Chest pain Reason for Consultation: Chest pain, CHF  HPI: Peter Dodson is a 34 year old AA male with a hx of poorly controlled HTN, chronic combined CHF, CKD stage III, microcytic anemia, marijuana use, and habitual alcohol use who presented to ED for chest pain.  The patient had been admitted many times over the last 3 years. He has been noncompliant with medications and follow-up due to lack of insurance. According to the patient, while at [facility] in [place] 2014, he underwent a stress test followed by cardiac catheterization. He was told he did not have any significant blockages in his coronary artery however his EF was very low at the time, roughly 20-25%. We do not have these exact results. Echo 08/2012 at [facility] showed an EF of 20% as per [facility]. He was placed on medical therapy. He was put on LifeVest due to significant LV dysfunction. However, he lost his job subsequently and returned LifeVest early 2015. More recently he was admitted 10/2013 with hypertensive emergency with pressure 220s/130s. He was seen by Dr. Mayford Knife during hospital admission. Echo 10/2013 showed improved EF of 35-40% with moderate LVH. He was also found to have CKD and microcytic anemia. Renal artery [place] was ordered but doesn't look like this was done before discharge. Renin/aldosterone level normal. He was discharged with Lisinopril (Cr stable at 1.61), hydralazine and carvedilol 25mg  BID. He was scheduled for f/u with Dr. Mayford Knife but did not go due to lack of insurance. He went to [facility] ED for staple removal 03/2014 for arm injury and found to have a high blood pressure and stated that he had been out of medicine for "a few days." He was written for  Coreg, clonidine, hydralazine, and lisinopril for 30 day supply on 03/18/14. The patient reports he has been compliant with medications recently. He said he gets his BP meds from [facility]. He is eligibile for insurance at his job at [employer] but has not pursued this further yet.  He presented to [facility] today with chest pain. Yesterday in the morning he had nausea, vomiting, & abdominal pain. Around 1pm he smoked marijuana as he does daily. He works third shift at [employer] and while at work around [employer], he suddenly felt left sided chest pain that radiated to his neck while he was sitting. He has had constant "8/10 pain until he received meds at hospital" - felt relieve with Percocet but NTG didn't help. He reports orthopnea and unknown amount of weight gain, but denies any current SOB, HA, dizziness, LE swelling, or palpitation. For the past several months he's had intermittent chest pain that he relates mostly to worse with eating. It lasts seconds to minutes. He has not tried any antacids. He also has occasional chest discomfort when exerting himself (such as walking up stairs) but this does not happen every time. He drinks 4-5 liquor shots several times a week. He checks his BP occasionally at [facility] and it runs in range of 160s/90s. Denies any BRBPR, melena, hematemesis, recent travel, surgery or bedrest. Drinks lots of fluid including gatorade, does not follow low salt diet.  Workup in the ER reveals microcytic anemia again with Hgb 11.2, thrombocytopenia with Plt 121, negative troponin x 2, lipase wnl, K  3.4, Cr 1.73, albumin 3.2, BNP 602. CXR with chronic cardiomegaly, increased interstitial markings favored due to vascular congestion, consider mild developing edema. SBP 120s-150s.   Past Medical History  Diagnosis Date  . Hypertension   . Obesity   . Chronic combined systolic and diastolic CHF (congestive heart failure)   . Tobacco abuse   . Morbid obesity   . CKD  (chronic kidney disease), stage III   . Marijuana abuse   . Microcytic anemia       Most Recent Cardiac Studies: 2D echo 10/2013 - Left ventricle: The cavity size was moderately dilated. Wall thickness was increased in a pattern of moderate LVH. Systolic function was moderately reduced. The estimated ejection fraction was in the range of 35% to 40%. Diffuse hypokinesis. Doppler parameters are consistent with abnormal left ventricular relaxation (grade 1 diastolic dysfunction). - Aortic valve: There was mild regurgitation. - Left atrium: The atrium was moderately dilated. - Pericardium, extracardiac: A trivial pericardial effusion was identified.   Surgical History:  Past Surgical History  Procedure Laterality Date  . Knee surgery       Home Meds: Prior to Admission medications   Medication Sig Start Date End Date Taking? Authorizing Provider  acetaminophen (TYLENOL) 500 MG tablet Take 500 mg by mouth every 6 (six) hours as needed for mild pain or moderate pain (Pt took 2000mg  this morning).   Yes Historical Provider, MD  carvedilol (COREG) 25 MG tablet Take 1 tablet (25 mg total) by mouth 2 (two) times daily with a meal. 03/18/14  Yes Mercedes Camprubi-Soms, PA-C  cloNIDine (CATAPRES) 0.3 MG tablet Take 1 tablet (0.3 mg total) by mouth 2 (two) times daily. 03/18/14  Yes Mercedes Camprubi-Soms, PA-C  hydrALAZINE (APRESOLINE) 100 MG tablet Take 1 tablet (100 mg total) by mouth 3 (three) times daily. 03/18/14  Yes Mercedes Camprubi-Soms, PA-C  HYDROcodone-acetaminophen (NORCO/VICODIN) 5-325 MG per tablet Take 1-2 tablets by mouth every 4 (four) hours as needed for moderate pain.   Yes Historical Provider, MD  lisinopril (PRINIVIL,ZESTRIL) 20 MG tablet Take 1 tablet (20 mg total) by mouth 2 (two) times daily. 03/18/14  Yes Mercedes Camprubi-Soms, PA-C  carvedilol (COREG) 25 MG tablet Take 1 tablet (25 mg total) by mouth 2 (two) times daily with a meal. 10/31/13   Stark Bray, MD    cloNIDine (CATAPRES) 0.3 MG tablet Take 1 tablet (0.3 mg total) by mouth 2 (two) times daily. 10/31/13   Stark Bray, MD  hydrALAZINE (APRESOLINE) 100 MG tablet Take 1 tablet (100 mg total) by mouth 3 (three) times daily. 10/31/13   Stark Bray, MD  lisinopril (PRINIVIL,ZESTRIL) 20 MG tablet Take 1 tablet (20 mg total) by mouth 2 (two) times daily. 10/31/13   Stark Bray, MD    Inpatient Medications:       Allergies: No Known Allergies  History   Social History  . Marital Status: Single    Spouse Name: N/A  . Number of Children: N/A  . Years of Education: N/A   Occupational History  . Security Guard    Social History Main Topics  . Smoking status: Current Every Day Smoker  . Smokeless tobacco: Not on file  . Alcohol Use: 3.5 oz/week    7 Standard drinks or equivalent per week  . Drug Use: Yes    Special: Marijuana     Comment: last used 1 month ago  . Sexual Activity: Not on file   Other Topics Concern  . Not on  file   Social History Narrative     Family History  Problem Relation Age of Onset  . Hypertension Mother   . Hypertension Father   . Diabetes Mother   . Diabetes Father      Review of Systems: General: negative for chills, fever, night sweats   Cardiovascular + for chest pain. Negative for edema, palpitations, paroxysmal nocturnal dyspnea, shortness of breath or dyspnea on exertion. Dermatological: negative for rash Respiratory: negative for cough or wheezing Urologic: negative for hematuria Abdominal: negative for diarrhea, bright red blood per rectum, melena, or hematemesis Neurologic: negative for visual changes, syncope, or dizziness All other systems reviewed and are otherwise negative except as noted above.  Labs:  Lab Results  Component Value Date   WBC 6.9 05/19/2014   HGB 11.2* May 19, 2014   HCT 32.1* May 19, 2014   MCV 68.4* 05/19/2014   PLT 121* 05-19-14    Recent Labs Lab 2014-05-19 0929  NA 142  K 3.4*  CL 107  CO2  28  BUN 17  CREATININE 1.73*  CALCIUM 8.7  PROT 5.6*  BILITOT 0.5  ALKPHOS 46  ALT 17  AST 19  GLUCOSE 116*    Radiology/Studies:  Dg Chest 2 View  2014-05-19   CLINICAL DATA:  34 year old male with midsternal chest pain radiating to the left. Increased pain with inspiration. Initial encounter. Current history of smoking.  EXAM: CHEST  2 VIEW  COMPARISON:  10/28/2013 and earlier.  FINDINGS: Chronic cardiomegaly. Other mediastinal contours are within normal limits. Basilar predominant increased interstitial markings. No pneumothorax. No consolidation. No definite pleural effusion. No acute osseous abnormality identified.  IMPRESSION: Chronic cardiomegaly. Basilar predominant increased interstitial markings favor related to vascular congestion. Consider mild/developing interstitial edema.   Electronically Signed   By: Odessa Fleming M.D.   On: 05/19/14 10:21    Wt Readings from Last 3 Encounters:  05-19-2014 339 lb 8 oz (153.996 kg)  10/31/13 330 lb 4 oz (149.8 kg)  01/23/12 344 lb 5.7 oz (156.2 kg)    EKG: NSR 71bpm LVH and left axis deviation, TWI in I and avL - no acute change from prior  Physical Exam: Blood pressure 153/78, pulse 60, temperature 98.3 F (36.8 C), temperature source Oral, resp. rate 20, height  (1.905 m), weight 339 lb 8 oz (153.996 kg), SpO2 100 %. General: Well developed, well nourished, obese male in no acute distress. Head: Normocephalic, atraumatic, sclera non-icteric, no xanthomas, nares are without discharge.  Neck: Bilateral carotid bruits. + JVD. Question of left subclavian bruit. Lungs: Clear bilaterally to auscultation without wheezes, rales, or rhonchi. Breathing is unlabored. Heart: RRR with S1 S2. + 2/6 diastolic murmur at LUSB. Abdomen: Soft, non-tender, with normoactive bowel sounds.  Msk:  Strength and tone appear normal for age. Extremities: No clubbing or cyanosis. Tr-1+ pitting edema bilaterally with baseline larger leg habitus.  Distal pedal  pulses are 2+ and equal bilaterally. Neuro: Alert and oriented X 3. No facial asymmetry. No focal deficit. Moves all extremities spontaneously. Psych:  Responds to questions appropriately with a normal affect.    Assessment and Plan:   1. Persistent chest pain with negative troponins, recent nausea/vomiting, question gastrointestional-related  - Troponins negative x 2 despite prolonged pain since 4am, along with history of reported negative catheterization - Doubt chest pain is related to ischemia - Could be due to worsened HF but has risk factors for GI etiology including excessive alcohol intake - Check UDS  2. Acute on chronic combined CHF EF  35-40% likely due to hypertensive heart disease - does not follow low salt diet or fluid restriction, 20oz gatorade at bedside, counseled - Received one dose of lasix  IV in ED - Lasix naive so would follow volume status and creatinine before giving additional Lasix  - Recommend to continue home Coreg, hydralazine, clonidine but hold lisinopril until we can see where creatinine goes (he would benefit from resumption of ACEI long-term) - recommend repeat 2D echo  3. CKD III, baseline appears 1.5-1.6 in 10/2013 - will order UA to evaluate for proteinuria - follow with diuresis  4. Hypertension, diagnosed around age 53 - based on today's BP it would seem that he has in fact been compliant with medications - recommend renal arterial duplex, TSH, free T4 when patient is admitted - outpatient sleep study recommended  5. Hypokalemia - have ordered now and repeat in 3 hours  6. Carotid bruits and left subclavian bruit - will d/w Dr. Tresa Endo  7. Habitual alcohol abuse, marijuana abuse, counseled regarding dangers of continued use 8. Morbid obesity Body mass index is 42.43 kg/(m^2). 9. Microcytic anemia/thrombocytopenia, per IM 10. Right arm pain at site of prior staples, per IM  Signed, Dayna Dunn PA-C 05-29-2014, 2:06 PM   Patient  seen and examined. Agree with assessment and plan.  Peter Dodson is a 34 year old African-American male with a history of obesity, poorly controlled hypertension, chronic kidney disease, stage III, microcytic anemia, who has had poorly controlled hypertension.  Reportedly, he had undergone cardiac catheterization 2 years ago in [place] and was told of having fairly normal coronary arteries without stenoses.  He had reduced LV function, which was felt to be nonischemic.  2014.  Ejection fraction was 20-25%.  He was treated with medical therapy including ACE inhibitor patient, hydralazine and carvedilol; however, there has been some issues with medication compliance when he is run out of some of his medications.  He presents today after experiencing chest pain since 4 AM, which he describes as left-sided, nonexertional, and seems to be worse when he takes a deep breath.  He denies any recent heavy lifting or chest wall soreness.  He is unaware of any arrhythmia.  Upon presentation , his blood pressure was 153/78 and pulse 60.  He had a thick neck without overt JVD.  He had decreased breath sounds at his bases.  Rhythm was regular with a 2/6 diastolic murmur compatible with aortic insufficiency.  There was no S3 gallop.  Abdomen was soft, nontender.  There was no evidence for hepatosplenomegaly.  He had trace edema bilaterally in hiis lower extremities.  ECG reveals normal sinus rhythm with left ventricular hypertrophy and repolarization changes.  There was mildly prolonged QTc interval at 460 ms.  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  When questioned about his blood count he states that this was found when he was tested in high school. I suspect that his chest pain is not ischemic in etiology.  He does have some pleuritic features with increased chest pain with deep breathing.  His last echo Doppler study had demonstrated an ejection fraction at  35-40% with aortic insufficiency and trivial pericardial effusion.  We will recommend a follow-up echo Doppler study.  Further evaluation.  With his renal insufficiency and creatinine of 1.7.  Presently, I would recommend that his lisinopril in a.m. pending reassessment of renal function.  May be possible to amlodipine initially at 5 mg to  his medical regimen.  If his renal function is stable, I would consider reinstituting ACE inhibitor perhaps on a lower dose.    Lennette Biharihomas A. Kelly, MD, [facility] HospitalFACC 05/07/2014 6:18 PM

## 2014-05-08 NOTE — ED Notes (Signed)
Pt c/o mid sternal to left sided CP starting this am while at work; pt sts some increased pain with inspiration

## 2014-05-08 NOTE — ED Provider Notes (Signed)
CSN: [id number]     Arrival date & time 05-13-2014  0865 History   First MD Initiated Contact with Patient 05-13-14 [phone]     Chief Complaint  Patient presents with  . Chest Pain     (Consider location/radiation/quality/duration/timing/severity/associated sxs/prior Treatment) HPI Peter Dodson is a 34 y.o. male with hx of HTN, CHF, presents to ED with complaint of chest pain. Pt states he had abdominal pain yesterday, nausea, vomiting. States he works third shift so he went to work last night. While at work his abdominal pain and vomiting subsided. States closer towards this morning he started feeling left-sided chest pain that radiated to his neck and left shoulder. States pain has been constant since then. There is no associated symptoms, he is not short of breath, dizzy, nauseated, lightheaded. There is no pleuritic component to his pain. He states he took his blood pressure medications when he started feeling the pain this morning. He states that did not help. No history of prior coronary disease. He is a daily smoker. Unsure if his cholesterol status. He currently does not have a primary care doctor cardiologist.  Past Medical History  Diagnosis Date  . Hypertension   . Obesity    Past Surgical History  Procedure Laterality Date  . Knee surgery     Family History  Problem Relation Age of Onset  . Hypertension Mother   . Hypertension Father   . Diabetes Mother   . Diabetes Father    History  Substance Use Topics  . Smoking status: Current Every Day Smoker  . Smokeless tobacco: Not on file  . Alcohol Use: 3.5 oz/week    7 Standard drinks or equivalent per week    Review of Systems  Constitutional: Negative for fever and chills.  Respiratory: Positive for chest tightness. Negative for cough and shortness of breath.   Cardiovascular: Positive for chest pain. Negative for palpitations and leg swelling.  Gastrointestinal: Negative for nausea, vomiting, abdominal pain, diarrhea  and abdominal distention.  Genitourinary: Negative for dysuria, urgency, frequency and hematuria.  Musculoskeletal: Negative for myalgias, arthralgias, neck pain and neck stiffness.  Skin: Negative for rash.  Allergic/Immunologic: Negative for immunocompromised state.  Neurological: Negative for dizziness, weakness, light-headedness, numbness and headaches.  All other systems reviewed and are negative.     Allergies  Review of patient's allergies indicates no known allergies.  Home Medications   Prior to Admission medications   Medication Sig Start Date End Date Taking? Authorizing Provider  acetaminophen (TYLENOL) 500 MG tablet Take 500 mg by mouth every 6 (six) hours as needed for mild pain or moderate pain (Pt took  this morning).   Yes Historical Provider, MD  carvedilol (COREG) 25 MG tablet Take 1 tablet (25 mg total) by mouth 2 (two) times daily with a meal. 03/18/14  Yes Mercedes Camprubi-Soms, PA-C  cloNIDine (CATAPRES) 0.3 MG tablet Take 1 tablet (0.3 mg total) by mouth 2 (two) times daily. 03/18/14  Yes Mercedes Camprubi-Soms, PA-C  hydrALAZINE (APRESOLINE) 100 MG tablet Take 1 tablet (100 mg total) by mouth 3 (three) times daily. 03/18/14  Yes Mercedes Camprubi-Soms, PA-C  HYDROcodone-acetaminophen (NORCO/VICODIN) 5-325 MG per tablet Take 1-2 tablets by mouth every 4 (four) hours as needed for moderate pain.   Yes Historical Provider, MD  lisinopril (PRINIVIL,ZESTRIL) 20 MG tablet Take 1 tablet (20 mg total) by mouth 2 (two) times daily. 03/18/14  Yes Mercedes Camprubi-Soms, PA-C  carvedilol (COREG) 25 MG tablet Take 1 tablet (25 mg total) by mouth  2 (two) times daily with a meal. 10/31/13   Stark Bray, MD  cloNIDine (CATAPRES) 0.3 MG tablet Take 1 tablet (0.3 mg total) by mouth 2 (two) times daily. 10/31/13   Stark Bray, MD  hydrALAZINE (APRESOLINE) 100 MG tablet Take 1 tablet (100 mg total) by mouth 3 (three) times daily. 10/31/13   Stark Bray, MD  lisinopril  (PRINIVIL,ZESTRIL) 20 MG tablet Take 1 tablet (20 mg total) by mouth 2 (two) times daily. 10/31/13   Stark Bray, MD   BP 131/66 mmHg  Pulse 74  Temp(Src) 98.3 F (36.8 C) (Oral)  Resp 18  Ht  (1.905 m)  Wt 339 lb 8 oz (153.996 kg)  BMI 42.43 kg/m2  SpO2 98% Physical Exam  Constitutional: He is oriented to person, place, and time. He appears well-developed and well-nourished. No distress.  Morbidly obese  HENT:  Head: Normocephalic and atraumatic.  Eyes: Conjunctivae are normal.  Neck: Neck supple.  Cardiovascular: Normal rate, regular rhythm and normal heart sounds.   Pulmonary/Chest: Effort normal. No respiratory distress. He has no wheezes. He has no rales.  Abdominal: Soft. Bowel sounds are normal. He exhibits no distension. There is no tenderness. There is no rebound.  Musculoskeletal:  Trace LE Edema bilaterally  Neurological: He is alert and oriented to person, place, and time.  Skin: Skin is warm and dry.  Nursing note and vitals reviewed.   ED Course  Procedures (including critical care time) Labs Review Labs Reviewed  CBC WITH DIFFERENTIAL/PLATELET - Abnormal; Notable for the following:    Hemoglobin 11.2 (*)    HCT 32.1 (*)    MCV 68.4 (*)    MCH 23.9 (*)    RDW 17.8 (*)    Platelets 121 (*)    All other components within normal limits  COMPREHENSIVE METABOLIC PANEL - Abnormal; Notable for the following:    Potassium 3.4 (*)    Glucose, Bld 116 (*)    Creatinine, Ser 1.73 (*)    Total Protein 5.6 (*)    Albumin 3.2 (*)    GFR calc non Af Amer 51 (*)    GFR calc Af Amer 59 (*)    All other components within normal limits  BRAIN NATRIURETIC PEPTIDE - Abnormal; Notable for the following:    B Natriuretic Peptide 602.9 (*)    All other components within normal limits  LIPASE, BLOOD  I-STAT TROPOININ, ED  Rosezena Sensor, ED    Imaging Review Dg Chest 2 View  04/29/2014   CLINICAL DATA:  34 year old male with midsternal chest pain radiating to  the left. Increased pain with inspiration. Initial encounter. Current history of smoking.  EXAM: CHEST  2 VIEW  COMPARISON:  10/28/2013 and earlier.  FINDINGS: Chronic cardiomegaly. Other mediastinal contours are within normal limits. Basilar predominant increased interstitial markings. No pneumothorax. No consolidation. No definite pleural effusion. No acute osseous abnormality identified.  IMPRESSION: Chronic cardiomegaly. Basilar predominant increased interstitial markings favor related to vascular congestion. Consider mild/developing interstitial edema.   Electronically Signed   By: Odessa Fleming M.D.   On: 05/01/2014 10:21     EKG Interpretation   Date/Time:  Tuesday May 08 2014 08:54:45 EDT Ventricular Rate:  71 PR Interval:  174 QRS Duration: 108 QT Interval:  424 QTC Calculation: 460 R Axis:   -25 Text Interpretation:  Normal sinus rhythm Biatrial enlargement Left  ventricular hypertrophy T wave abnormality, consider lateral ischemia  Prolonged QT Abnormal ECG No significant change since  last tracing  Confirmed by BEATON  MD, ROBERT [id number]) on 04/24/2014 9:22:12 AM      MDM   Final diagnoses:  Chest pain, unspecified chest pain type  Congestive heart failure, unspecified congestive heart failure chronicity, unspecified congestive heart failure type    Patient with constant left-sided chest pain onset this morning while at work. No associated symptoms. Patient does have risk factors including hypertension, obesity, he is a smoker. Will check labs, EKG, chest x-ray, will place on a monitor. Aspirin ordered. Nitroglycerin sublingual ordered.  12:17 PM Labs OK. Creat elevated slightly off baseline. CXR showing some fluid overload. Will give lasix. Cardiology consulted, will come by and see.   4:21 PM Pt seen by cardiology. Not convinced this is cardiac, asked internal medicine admission given multiple risk factors. They will consult. Pt continues to have pain. Will give morphine.    Filed Vitals:   04/12/2014 1115 05/10/2014 1200 05/03/2014 1201 05/10/2014 1300  BP: 143/68 141/78 141/78 153/78  Pulse: 56 61 58 60  Temp:      TempSrc:      Resp: 16   20  Height:      Weight:      SpO2: 100% 98%  100%   Pt will be admitted to resident service.     Jaynie Crumbleatyana Dorenda Pfannenstiel, PA-C 04/27/2014 1703  Nelva Nayobert Beaton, MD 05/13/14 [phone]

## 2014-05-08 NOTE — Progress Notes (Signed)
Patient transferred from the ED via stretcher to room 3E06. Oriented to the room and room equipment and to the heart failure floor. VSS. CMT notified and confirmed on the telemetry monitor. Will continue to monitor patient to end of shift.

## 2014-05-09 DIAGNOSIS — I129 Hypertensive chronic kidney disease with stage 1 through stage 4 chronic kidney disease, or unspecified chronic kidney disease: Secondary | ICD-10-CM | POA: Diagnosis not present

## 2014-05-09 DIAGNOSIS — N183 Chronic kidney disease, stage 3 (moderate): Secondary | ICD-10-CM | POA: Diagnosis not present

## 2014-05-09 DIAGNOSIS — R079 Chest pain, unspecified: Secondary | ICD-10-CM | POA: Diagnosis not present

## 2014-05-09 DIAGNOSIS — I469 Cardiac arrest, cause unspecified: Secondary | ICD-10-CM | POA: Diagnosis not present

## 2014-05-09 LAB — BASIC METABOLIC PANEL
Anion gap: 3 — ABNORMAL LOW (ref 5–15)
BUN: 16 mg/dL (ref 6–23)
CHLORIDE: 105 mmol/L (ref 96–112)
CO2: 32 mmol/L (ref 19–32)
Calcium: 8.8 mg/dL (ref 8.4–10.5)
Creatinine, Ser: 1.82 mg/dL — ABNORMAL HIGH (ref 0.50–1.35)
GFR calc non Af Amer: 48 mL/min — ABNORMAL LOW (ref >90)
GFR, EST AFRICAN AMERICAN: 56 mL/min — AB (ref >90)
Glucose, Bld: 118 mg/dL — ABNORMAL HIGH (ref 70–99)
Potassium: 4.1 mmol/L (ref 3.5–5.1)
Sodium: 140 mmol/L (ref 135–145)

## 2014-05-09 LAB — CBC
HEMATOCRIT: 32.3 % — AB (ref 39.0–52.0)
Hemoglobin: 11 g/dL — ABNORMAL LOW (ref 13.0–17.0)
MCH: 23.5 pg — ABNORMAL LOW (ref 26.0–34.0)
MCHC: 34.1 g/dL (ref 30.0–36.0)
MCV: 68.9 fL — ABNORMAL LOW (ref 78.0–100.0)
Platelets: 116 10*3/uL — ABNORMAL LOW (ref 150–400)
RBC: 4.69 MIL/uL (ref 4.22–5.81)
RDW: 17.8 % — ABNORMAL HIGH (ref 11.5–15.5)
WBC: 6.6 10*3/uL (ref 4.0–10.5)

## 2014-05-09 LAB — GLUCOSE, CAPILLARY: Glucose-Capillary: 91 mg/dL (ref 70–99)

## 2014-05-09 LAB — TROPONIN I
TROPONIN I: 0.04 ng/mL — AB (ref <0.031)
TROPONIN I: 0.04 ng/mL — AB (ref <0.031)

## 2014-05-09 LAB — T4, FREE: Free T4: 1.43 ng/dL (ref 0.80–1.80)

## 2014-05-09 MED ORDER — SODIUM BICARBONATE 8.4 % IV SOLN
INTRAVENOUS | Status: AC
  Filled 2014-05-09: qty 100

## 2014-05-09 MED ORDER — HYDROCODONE-ACETAMINOPHEN 5-325 MG PO TABS
1.0000 | ORAL_TABLET | Freq: Four times a day (QID) | ORAL | Status: DC | PRN
  Administered 2014-05-09: 2 via ORAL
  Filled 2014-05-09: qty 2

## 2014-05-09 MED ORDER — EPINEPHRINE HCL 0.1 MG/ML IJ SOSY
PREFILLED_SYRINGE | INTRAMUSCULAR | Status: AC
  Filled 2014-05-09: qty 20

## 2014-05-09 MED FILL — Medication: Qty: 1 | Status: AC

## 2014-05-10 LAB — HEMOGLOBIN A1C
Hgb A1c MFr Bld: 5.4 % (ref 4.8–5.6)
Mean Plasma Glucose: 108 mg/dL

## 2014-05-11 DIAGNOSIS — 419620001 Death: Secondary | SNOMED CT

## 2014-05-11 NOTE — Procedures (Signed)
Intubation Procedure Note Peter Dodson [id number] 08/28/1980             Procedure: Intubation Indications: Airway protection and maintenance  Procedure Details Consent: Unable to obtain consent because of emergent medical necessity. Time Out: Verified patient identification, verified procedure, site/side was marked, verified correct patient position, special equipment/implants available, medications/allergies/relevent history reviewed, required imaging and test results available.  Performed  Maximum sterile technique was used including gloves and hand hygiene.  MAC and 4    Evaluation Hemodynamic Status: currently in cardiac arrest; O2 sats: not getting a good wave form during code. Patient's Current Condition: unstable Complications: Complications of current cardiac arrest. Patient did tolerate procedure well. Chest X-ray ordered to verify placement.  CXR: pending.   Gaetano HawthorneBrooker, Xochilth Standish 05/07/2014

## 2014-05-11 NOTE — Progress Notes (Signed)
Chaplain paged to code to be with pt's girlfriend and her cousin.   Chaplain also present during death notification.   Peter Dodson (pt girlfriend) and Peter Dodson (cousin) called family and friends some of which are coming to be with them, pt parents are en route whom live roughly 90 min away.   [name] expressed being in shock, "I can't believe it, this isn't real, he was just ok"  Chaplain provided emotional and spiritual support while facilitating story telling and aiding in navigating their grief.   Chaplain informed Spiritual Care team of events and informed family to call whenever needed.   Gala RomneyBrown, Brilee [place], Chaplain 05/08/2014 9:27 AM

## 2014-05-11 NOTE — Progress Notes (Addendum)
Notified on-call doctor, Dr. Allena KatzPatel, That patient was having no relief of pain even from the time of being in the emergency room. Patient states that the IV morphine that has been given to him in the ED and here on the floor has not helped him alleviate the pain at all. Patient states why is he here if his pain is not being managed. Notified doctor. Doctor spoke with patient. Orders given for one time dose of IV morphine 4mg . IV morphine 4mg  given as ordered.   A couple of hours later patient stated the IV morphine 4mg  helped to bring his pain level from a 9 to 6 on pain scale but was requesting if he could have something for pain. Explained to patient will speak to the doctor but if anything is scheduled, will probably be about 4-6 hours from the last dose of morphine. Patient stated that in the past he has taken vicodin before for a severe headache when blood pressure has been high, also stated that when he was in the ED today, he was given percocet and that seem to help better than the morphine. Notified Dr. Allena KatzPatel. New orders given to help manage pain during the night. Administered PRN pain medication as ordered. Will continue to monitor patient to end of shift.  Please make note, nurse offered to administer nitroglycerin tab per PRN order but patient refused. Explained to patient the side effect in which he knew about for he stated he has taken it before but stated it did not work. Therefore was not given. Patient did not want tylenol for break through pain as well.

## 2014-05-11 NOTE — Discharge Summary (Signed)
Name: Peter Dodson MRN: [id number] DOB: 01/04/1981 34 y.o.  Date of Admission: July 31, 2014  9:02 AM Date of Discharge: 05/11/2014 Attending Physician: Dr. Cyndie ChimeGranfortuna Discharge Diagnosis: Principal Problem:   Chest pain Active Problems:   Microcytic anemia   Nausea & vomiting   Acute on chronic combined systolic and diastolic congestive heart failure   Hypertensive heart disease with CHF   CKD (chronic kidney disease), stage III   Hypertension   Hypokalemia   Alcohol abuse   Marijuana use, episodic   Morbid obesity   Thrombocytopenia   Pain in the chest  Cause of death: Sudden Cardiac Death Time of death: 0753  Disposition and follow-up:   [facility] Dod[facility] expired condition.    Hospital Course: Peter Dodson was a 34 yo male with PMHx of Combined Grade 1 diastolic and systolic CHF EF 35-40%, HTN, CKD III, marijuana use and morbid obesity who presented with complaint of chest pain. The day prior to admission, patient had 10 episodes of non-bloody non-bilious vomiting and nausea which resolved on its own. Patient denied any fevers, chills, or diarrhea associated with vomiting. He stated this has happened to him about 15 times in the last 4 years. He had not had it evaluated before. He did admit to frequent daily marijuana use and was unsure if his vomiting was related. He denied any abdominal pain associated. He occasionally has had odynophagia in the past, but infrequently. He denied dysphagia. He denied reflux or history of gastric ulcers. This episode of nausea and vomiting resolved on its own the night prior to admiss. Patient had been tolerating po foods and fluid intake.  The morning of admission while at work around 0400 am, patient developed substernal and left sided chest pain with radiation to the neck. Pain was constant and severe at a 7-8/10. Nothing seemed to make the pain better although he tried tylenol for it. Movement,  eating and deep inspiration made the pain worse. Pain persisted for several hours until he decided to come into the ED when it did not resolve. Chest pain was not associated with diaphoresis, shortness of breath, nausea, vomiting, lightheadedness, or syncope. At the time of interview, pain was a 6/10.   Patient did have risk factors of HTN and CHF. He admitted to daily marijuana use, but denied tobacco use. He drank 4-5 shots of alcohol twice a week. He denied other illicit drug use. He had been compliant with his medications. His last exercise stress test and cath were in 2014 and patient reported the cath was normal. He denied any family history of heart disease. He denied history of anxiety or reflux. He did admit to chronic DOE and chest pain at baseline with exertion, but he had never had pain like this before.   Vital signs on admission showed the patient was afebrile at 98.3, blood pressure 131/66, HR 74, RR 18, satting 98% on room air. Labs were without leukocytosis at 6.9. Poc troponin negative x 2. BNP elevated at 602.9 without priors. CXR showed chronic cardiomegaly (unchanged from prior CXR), basilar predominant increased interstitial markings favor related to vascular congestion and possible mild/developing interstitial edema. Patient had a heart score of 2 giving him a 0.9-1.7% risk of MACE. Differentials intially included ACS, unstable angina, GI related (esophageal erosion, GERD), or volume overload. Patient was seen by cardiology who doubted pain was ischemic related and recommended repeat Echo. Patient received ASA 325 mg in the ED and morphine 4 mg  once. There was no concern for DVT/PE as patient did not have clinical signs or symptoms of DVT, PE was not the most likely diagnosis, patient was not tachycardic, no history of immobilization or surgery with the last 4 weeks, no previously diagnosed DVT or PE, no hemoptysis, and no history of malignancy with treatment. Well's score was 0. There was  no concern for aortic dissection given lack of hypotension/hypertension, no radiation of pain to the back, no tachycardia and CXR did not show widened mediastinum or left pleural effusion. Cardiac tamponade was very unlikely given patient was normotensive with a normal heart rate. Patient had no JVD on exam nor distant heart sounds. He did have an appreciable 3/6 diastolic murmur best auscultated at the tricuspid area. Patient was placed on telemetry and continued on Nitroglycerin 0.4 mg SL Q70min prn, Carvedilol 25 mg BID, Zofran 4 mg Q6H prn, Protonix 40 mg daily. Patient was tried on a GI cocktail once. We trended troponins 0.04>0.04>0.04. EKG was unremarkable. There were no events on telemetry overnight. Lipid panel, TSH, Free T4, and HgbA1c were within normal limits. Echo order was placed, but had not been completed. Patient continued to have pain overnight for which he was given morphine 1 mg x 2 and 4 mg IV x 2. Patient also received Norco 5-325 mg 2 tablets at 0300. That morning aroun 0700, nurse entered patient's room to administer morning medications. Patient was in his normal state of health, conversing with the nurse and the girlfriend. Pain was a 6/10. As nurse was leaving the room, the girlfriend called out to the nurse and they saw the patient's eyes roll back in his head and he had "convulsion like tremors." Rapid response was called. Patient's heart than brady'd down from sinus rhythm and went into a junctional rhythm then back to sinus rhythm. Code blue was called. Patient was noted to be in PEA and ACLS protocol was cycled. Code team and internal medicine team including myself, and Dr. Cyndie Chime, among other residents within Internal Medicine were present for the code. From the code blue documentation: "Multiple rounds of epinephrine, sodium bicarb, and calcium were given. Chest compressions were continued for some time without ROSC. Pt. Was intubated near the beginning of the code and adequate  oxygenation was maintained. All possible reversible causes were reviewed without any evidence of reversible cause in this patient. The code was subsequently called after the entire code team agreed that everything that could be done had been done, and the patient was noted to be in asystole without any pulse activity." Time of death was called at 0753.   HTN: Patient remained normotensive during hospitalization. He was managed on his home carvedilol 25 mg BID, clonidine 0.3 mg BID, hydralazine 100 mg TID. Home llisinopril 20 mg BID was held. Given his persistent hypertension despite compliance and given his young age, we ordered a renal artery duplex.   Combined Chronic Grade 1 Diastolic and Systolic CHF: Last echo in September 2015 showed EF of 35-40% with mild aortic regurgitation. Patient did not appear volume overloaded on exam, but BNP was elevated at 602.9 and CXR showed chronic cardiomegaly unchanged from prior CXR, basilar predominant increased interstitial markings favor related to vascular congestion and possible mild/developing interstitial edema. Patient was given Lasix 60 mg IV in the ED. Plans were to repeat an echocardiogram.   Signed: Jill Alexanders, DO PGY-1 Internal Medicine Resident Pager # [phone] 05/11/2014 8:45 PM

## 2014-05-11 NOTE — Progress Notes (Signed)
Entered patient's room after knocking and patient was awake. When administering morning medications, patient sat up from lying down in the bed so that he could take the medications. Patient took medications without any difficulty. Patient stated pain level a 6 when asked about how his pain. Explained to patient that the vicodin was not due at that time and he nodded that he understood. Asked patient if he had voided and if he used the urinal. Patient thought that the Nurse Tech had already emptied urine, which she did empty, but his girlfriend reminded him he had used the restroom and that there was more urine left in the urinal left in the bathroom. Went in bathroom to empty urinal. Came out of bathroom, and stated to patient that I hope he feels better and hope the doctors are able to manage his pain when making their rounds.   As nurse was leaving room, heard the girlfriend get nurse's attention and turned around and saw that patient's eyes rolled in the back of head. Patient begin to have convulsion-like tremors. All at the same time, begin calling out patient's name and when patient did not respond, yelled for help. Patient became unresponsive and had a blank stare. Patient then begin to snore loud then last begin to have foam noted in the mouth. Patient had a palpable pulse. During this time instructed staff member to call rapid response, nurse tech to get blood sugar,and fellow staff to get yaunker for suctioning. RR nurse arrived and at that time CMT called nurse and notified nurse that patient's heart rated brady down from sinus rhythm and went into junctional rhythm then back into sinus rhythm. Simultaneously patient was turned to side to prevent choking and suctioning of mouth performed. By this time Code was called for could not get a pulse on patient. CPR was started. Code team and Internal Medicine Teaching service all arrived swiftly.   After all that could be done medically to resuscitate him,  patient expired. The girlfriend was present during this time along with the chaplain. The girlfriend notified the family about the patient. The family of the patient are on their way and will speak with doctors when they arrive. Staff are to page doctors when family arrives.  Notified the eye bank. See form in chart. Signed off to oncoming nurse and charge nurse.

## 2014-05-11 NOTE — Progress Notes (Signed)
   04/23/2014 1100  Clinical Encounter Type  Visited With Patient and family together;Health care provider  Visit Type Follow-up;Death  Spiritual Encounters  Spiritual Needs Grief support  Stress Factors  Family Stress Factors Loss   Chaplain was paged to patient's room at 10:40 AM. Chaplain was notified that the family of the patient had arrived to the hospital and may need grief support. Chaplain was with the family while they were at the bedside of the patient. Family appeared to be grieving and supporting each other as needed. Patient's family shared tears, laughs, and several stories of their time with the patient. Chaplain made medical team aware that he is available for further grief support for the family. Page Merrilyn Puman-Call chaplain if further support needed today.  Cranston NeighborStrother, Donterrius Santucci R, Chaplain  11:48 AM

## 2014-05-11 NOTE — Plan of Care (Signed)
Problem: Phase I Progression Outcomes Goal: Anginal pain relieved Outcome: Not Progressing Have spoken with on-call doctor on a couple of occassions regarding chest pain management per patient request of pain medication

## 2014-05-11 NOTE — Progress Notes (Signed)
CODE BLUE NOTE  Patient Name: Peter Dodson   MRN: [id number]   Date of Birth/ Sex: 12/31/1980 , male      Admission Date: July 15, 2014  Attending Provider: Levert FeinsteinJames M Granfortuna, MD  Primary Diagnosis: Chest pain, unspecified chest pain type [R07.9] Congestive heart failure, unspecified congestive heart failure chronicity, unspecified congestive heart failure type [I50.9]    Indication: Pt was in his usual state of health until this AM, when he was noted to be unresponsive. He had an irregular rhythm on telemetry and then subsequently brady'd down prior to becoming unresponsive. Per Nursing, his eyes rolled back in his head and he had a brief episode of generalized convulsions. Code blue was subsequently called. At the time of arrival on scene, ACLS protocol was underway. He was noted to be in PEA. ACLS protocol was cycled. Multiple rounds of epinephrine, sodium bicarb, and calcium were given. Chest compressions were continued for some time without ROSC. Pt. Was intubated near the beginning of the code and adequate oxygenation was maintained. All possible reversible causes were reviewed without any evidence of reversible cause in this patient. The code was subsequently called after the entire code team agreed that everything that could be done had been done, and the patient was noted to be in asystole without any pulse activity.    Technical Description:  - CPR performance duration:  38 minutes  - Was defibrillation or cardioversion used? No   - Was external pacer placed? No  - Was patient intubated pre/post CPR? Yes    Medications Administered: Y = Yes; Blank = No Amiodarone    Atropine    Calcium  y  Epinephrine  y  Lidocaine    Magnesium  y  Norepinephrine    Phenylephrine    Sodium bicarbonate  y  Vasopressin    Other     Post CPR evaluation:  - Final Status - Was patient successfully resuscitated ? No   Miscellaneous Information:  - Time of death:  07:53  AM  - Primary team  notified?  Yes  - Family Notified? Yes        Yolande Jollyaleb G Shayla Heming, MD   05/04/2014, 8:19 AM

## 2014-05-11 DEATH — deceased

## 2014-05-26 NOTE — Progress Notes (Signed)
Accessing pt's chart to gather data for QI

## 2015-10-18 IMAGING — CR DG CHEST 2V
2 series · 2 of 2 positions shown · non-contrast
Comparison: 10/28/2013 and earlier.

CLINICAL DATA: 33-year-old male with midsternal chest pain
radiating to the left. Increased pain with inspiration. Initial
encounter. Current history of smoking.

EXAM:
CHEST  2 VIEW

[chest pa]
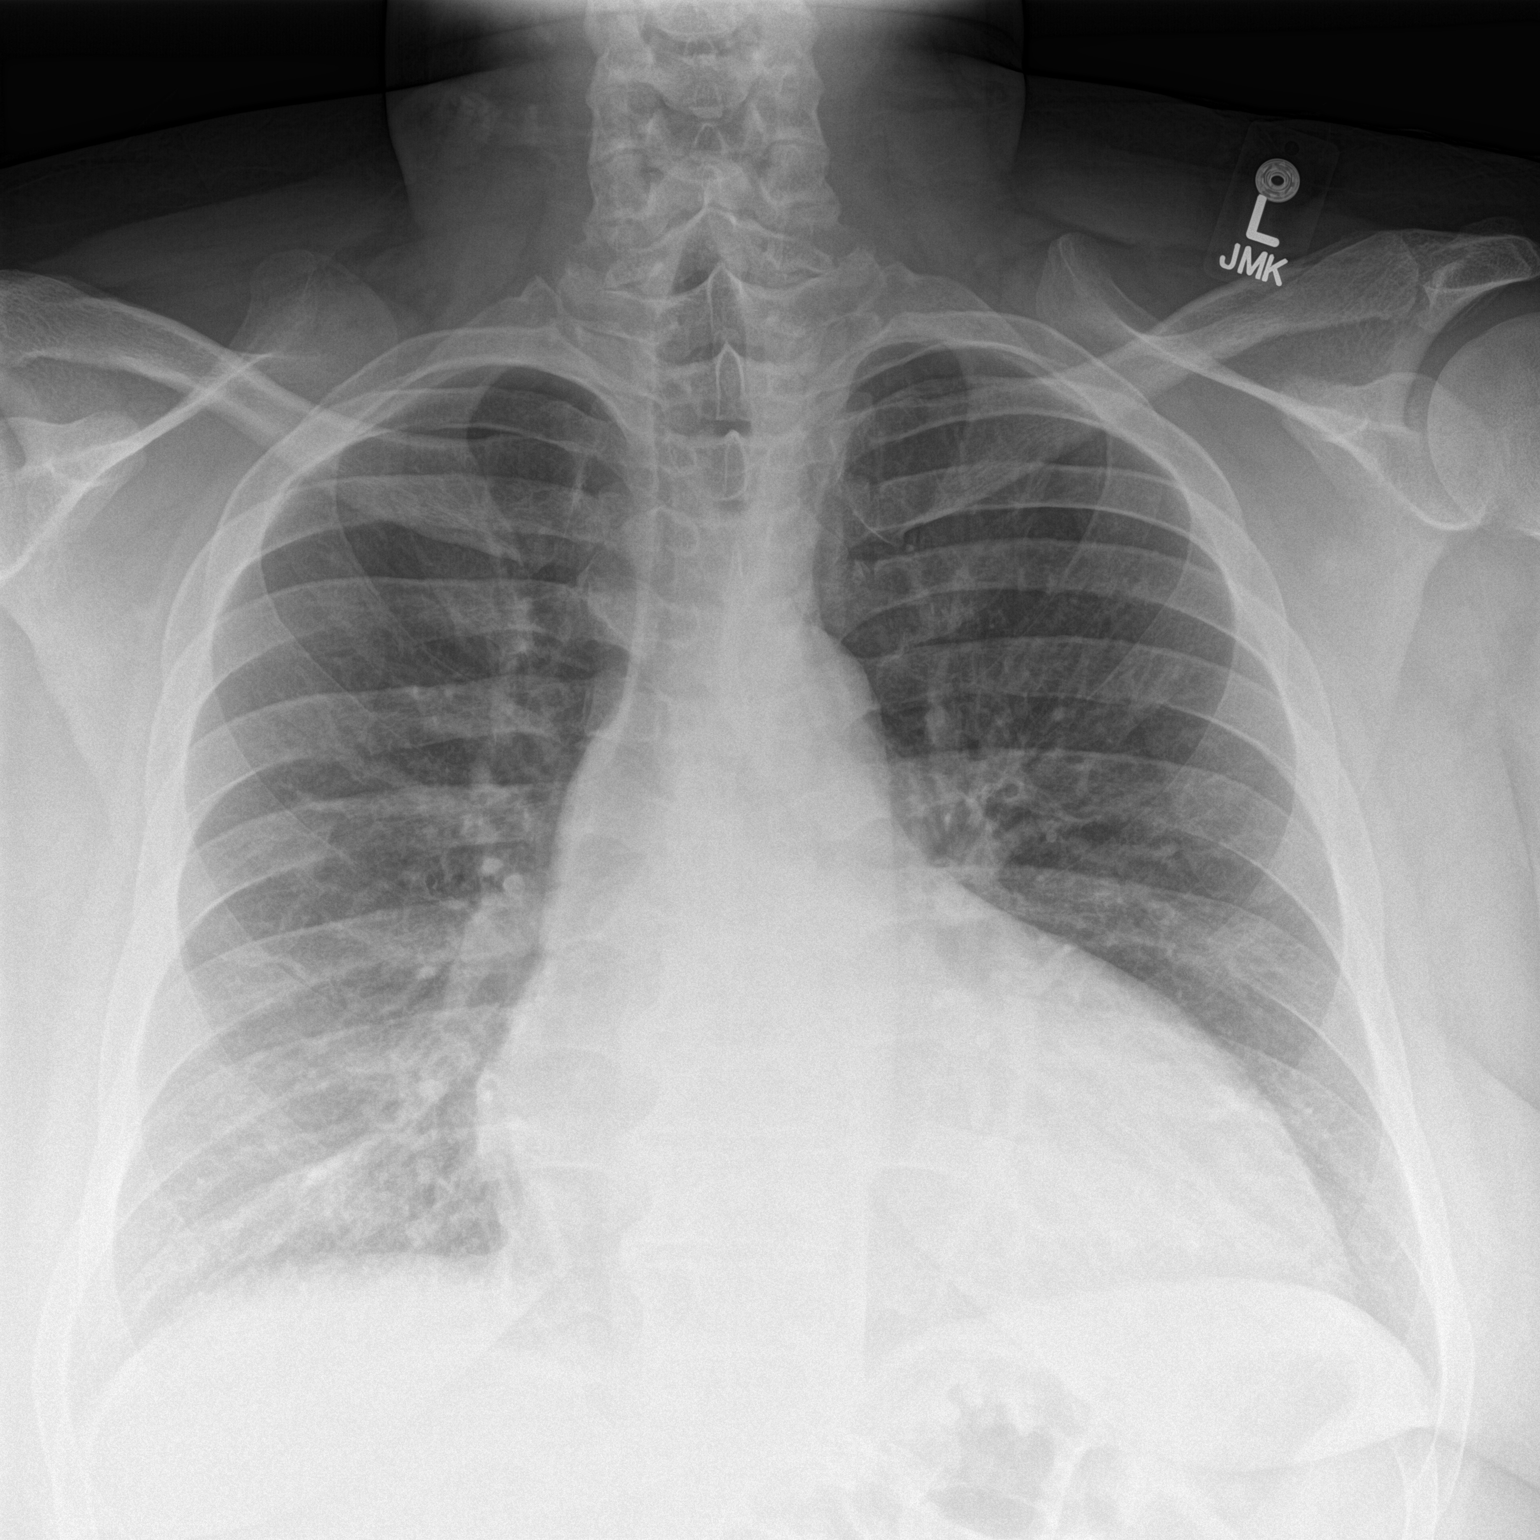

[chest lat]
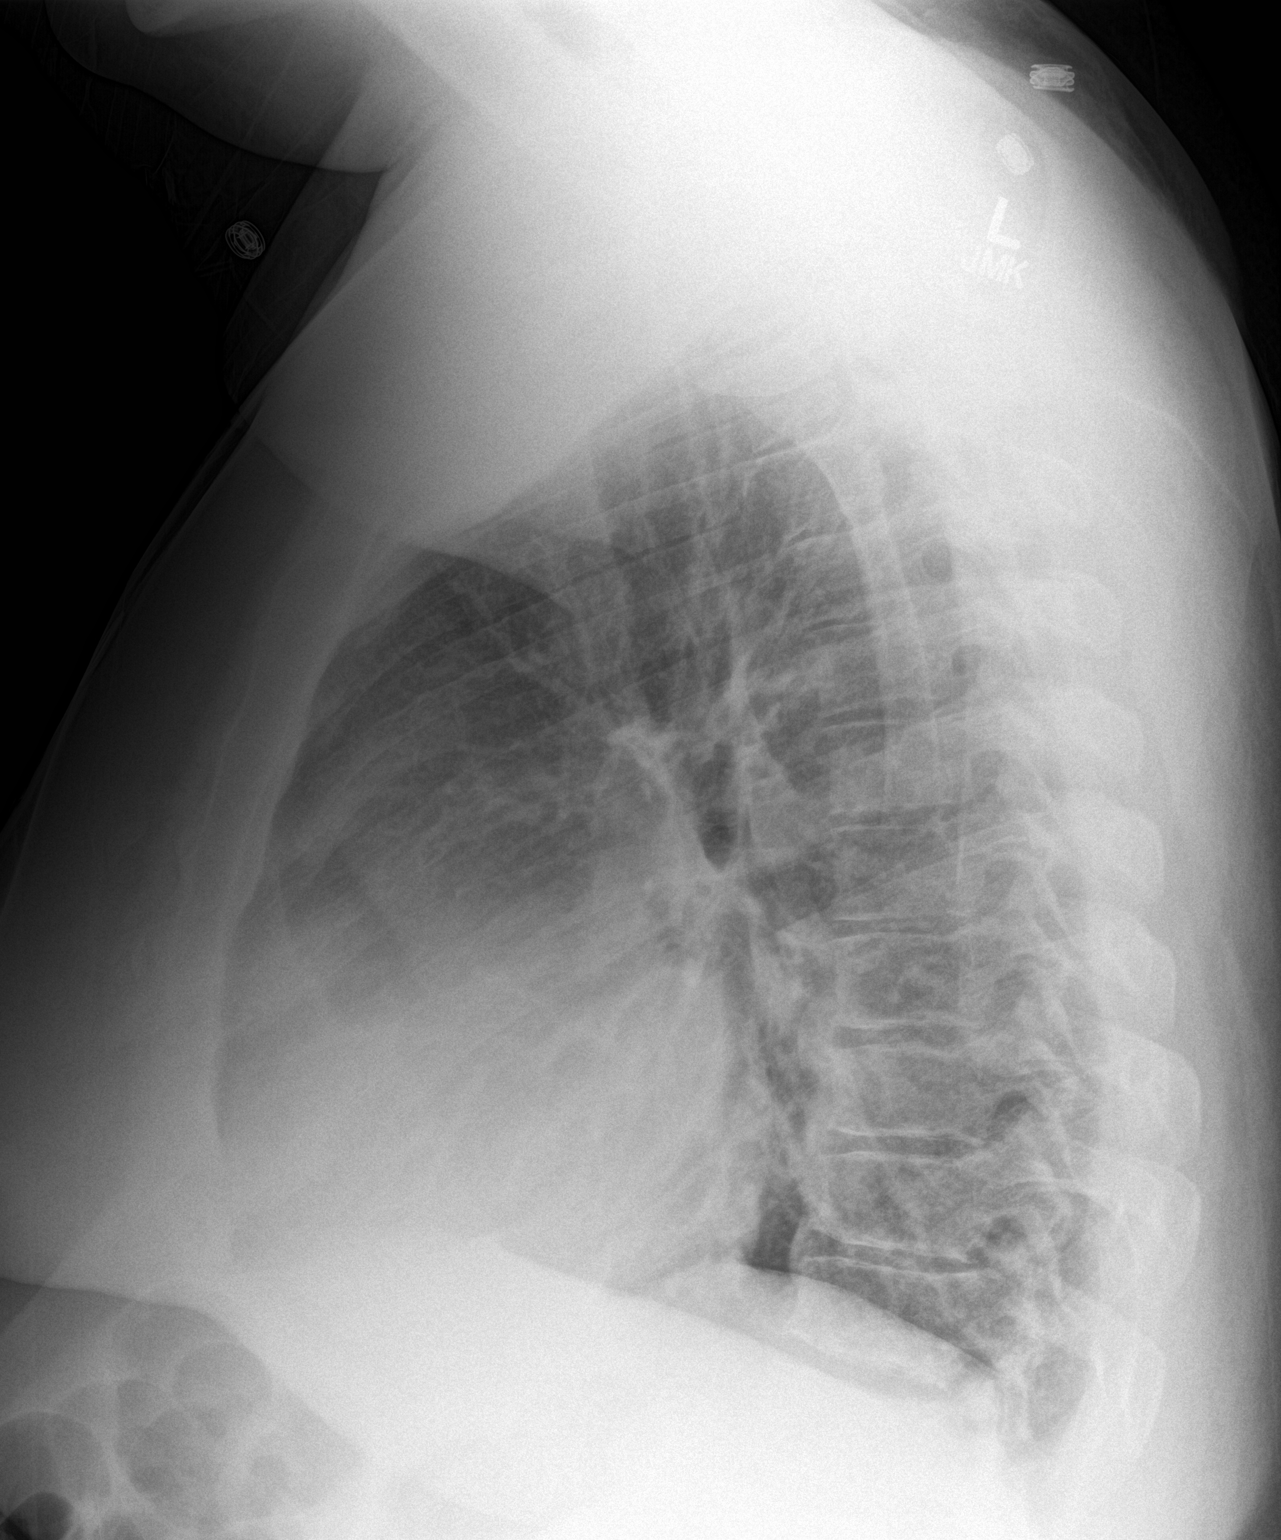

[2 of 2 positions shown; findings below may reference images not displayed]

FINDINGS: Chronic cardiomegaly. Other mediastinal contours are within normal
limits. Basilar predominant increased interstitial markings. No
pneumothorax. No consolidation. No definite pleural effusion. No
acute osseous abnormality identified.
IMPRESSION: Chronic cardiomegaly. Basilar predominant increased interstitial
markings favor related to vascular congestion. Consider
mild/developing interstitial edema.
